# Patient Record
Sex: Male | Born: 1970 | Race: White | Hispanic: No | Marital: Single | Smoking: Never smoker
Health system: Southern US, Community
[De-identification: ages and names within clinical notes are randomized; demographics above are authoritative.]

---

## 1999-05-10 ENCOUNTER — Encounter: Payer: Self-pay | Admitting: Neurosurgery

## 1999-05-10 ENCOUNTER — Encounter: Admission: RE | Admit: 1999-05-10 | Discharge: 1999-05-10 | Payer: Self-pay | Admitting: Neurosurgery

## 1999-06-27 ENCOUNTER — Encounter (INDEPENDENT_AMBULATORY_CARE_PROVIDER_SITE_OTHER): Payer: Self-pay | Admitting: Specialist

## 1999-06-27 ENCOUNTER — Other Ambulatory Visit: Admission: RE | Admit: 1999-06-27 | Discharge: 1999-06-27 | Payer: Self-pay | Admitting: Otolaryngology

## 1999-06-28 ENCOUNTER — Emergency Department (HOSPITAL_COMMUNITY): Admission: EM | Admit: 1999-06-28 | Discharge: 1999-06-28 | Payer: Self-pay | Admitting: Emergency Medicine

## 1999-12-28 ENCOUNTER — Encounter: Payer: Self-pay | Admitting: Neurosurgery

## 1999-12-28 ENCOUNTER — Encounter (HOSPITAL_COMMUNITY): Admission: RE | Admit: 1999-12-28 | Discharge: 2000-03-27 | Payer: Self-pay | Admitting: Neurosurgery

## 2000-01-11 ENCOUNTER — Encounter: Payer: Self-pay | Admitting: Neurosurgery

## 2000-01-25 ENCOUNTER — Encounter: Payer: Self-pay | Admitting: Neurosurgery

## 2000-09-23 ENCOUNTER — Ambulatory Visit (HOSPITAL_COMMUNITY): Admission: RE | Admit: 2000-09-23 | Discharge: 2000-09-23 | Payer: Self-pay | Admitting: Neurosurgery

## 2000-09-23 ENCOUNTER — Encounter: Payer: Self-pay | Admitting: Neurosurgery

## 2000-11-03 ENCOUNTER — Encounter: Admission: RE | Admit: 2000-11-03 | Discharge: 2000-11-03 | Payer: Self-pay | Admitting: Neurosurgery

## 2000-11-03 ENCOUNTER — Encounter: Payer: Self-pay | Admitting: Neurosurgery

## 2000-11-14 ENCOUNTER — Encounter: Payer: Self-pay | Admitting: Neurosurgery

## 2000-11-14 ENCOUNTER — Encounter: Admission: RE | Admit: 2000-11-14 | Discharge: 2000-11-14 | Payer: Self-pay | Admitting: Neurosurgery

## 2000-11-28 ENCOUNTER — Encounter: Admission: RE | Admit: 2000-11-28 | Discharge: 2000-11-28 | Payer: Self-pay | Admitting: Neurosurgery

## 2000-11-28 ENCOUNTER — Encounter: Payer: Self-pay | Admitting: Neurosurgery

## 2004-02-10 ENCOUNTER — Encounter: Admission: RE | Admit: 2004-02-10 | Discharge: 2004-02-10 | Payer: Self-pay | Admitting: Family Medicine

## 2007-07-03 ENCOUNTER — Ambulatory Visit (HOSPITAL_COMMUNITY): Admission: RE | Admit: 2007-07-03 | Discharge: 2007-07-03 | Payer: Self-pay | Admitting: Orthopedic Surgery

## 2007-07-13 ENCOUNTER — Ambulatory Visit (HOSPITAL_COMMUNITY): Admission: RE | Admit: 2007-07-13 | Discharge: 2007-07-13 | Payer: Self-pay | Admitting: Orthopedic Surgery

## 2007-07-24 ENCOUNTER — Ambulatory Visit (HOSPITAL_COMMUNITY): Admission: RE | Admit: 2007-07-24 | Discharge: 2007-07-24 | Payer: Self-pay | Admitting: Orthopedic Surgery

## 2007-08-20 ENCOUNTER — Ambulatory Visit (HOSPITAL_COMMUNITY): Admission: RE | Admit: 2007-08-20 | Discharge: 2007-08-20 | Payer: Self-pay | Admitting: Orthopedic Surgery

## 2007-08-20 ENCOUNTER — Encounter (INDEPENDENT_AMBULATORY_CARE_PROVIDER_SITE_OTHER): Payer: Self-pay | Admitting: Orthopedic Surgery

## 2007-08-20 ENCOUNTER — Ambulatory Visit: Payer: Self-pay | Admitting: Vascular Surgery

## 2008-03-31 ENCOUNTER — Encounter: Admission: RE | Admit: 2008-03-31 | Discharge: 2008-03-31 | Payer: Self-pay | Admitting: Family Medicine

## 2008-10-11 ENCOUNTER — Ambulatory Visit (HOSPITAL_COMMUNITY): Admission: RE | Admit: 2008-10-11 | Discharge: 2008-10-11 | Payer: Self-pay | Admitting: Orthopedic Surgery

## 2008-11-21 ENCOUNTER — Encounter: Admission: RE | Admit: 2008-11-21 | Discharge: 2008-11-21 | Payer: Self-pay | Admitting: Neurosurgery

## 2008-12-06 ENCOUNTER — Encounter: Admission: RE | Admit: 2008-12-06 | Discharge: 2008-12-06 | Payer: Self-pay | Admitting: Neurosurgery

## 2008-12-20 ENCOUNTER — Encounter: Admission: RE | Admit: 2008-12-20 | Discharge: 2008-12-20 | Payer: Self-pay | Admitting: Neurosurgery

## 2009-01-26 ENCOUNTER — Ambulatory Visit (HOSPITAL_COMMUNITY): Admission: RE | Admit: 2009-01-26 | Discharge: 2009-01-26 | Payer: Self-pay | Admitting: Orthopedic Surgery

## 2009-05-10 ENCOUNTER — Encounter: Admission: RE | Admit: 2009-05-10 | Discharge: 2009-05-10 | Payer: Self-pay | Admitting: Neurosurgery

## 2009-06-02 ENCOUNTER — Encounter: Admission: RE | Admit: 2009-06-02 | Discharge: 2009-06-02 | Payer: Self-pay | Admitting: Neurosurgery

## 2009-10-06 ENCOUNTER — Ambulatory Visit (HOSPITAL_COMMUNITY): Admission: RE | Admit: 2009-10-06 | Discharge: 2009-10-08 | Payer: Self-pay | Admitting: Neurosurgery

## 2010-05-11 LAB — COMPREHENSIVE METABOLIC PANEL
ALT: 56 U/L — ABNORMAL HIGH (ref 0–53)
AST: 35 U/L (ref 0–37)
Albumin: 4 g/dL (ref 3.5–5.2)
Alkaline Phosphatase: 63 U/L (ref 39–117)
BUN: 6 mg/dL (ref 6–23)
CO2: 29 mEq/L (ref 19–32)
Calcium: 9.8 mg/dL (ref 8.4–10.5)
Chloride: 102 mEq/L (ref 96–112)
Creatinine, Ser: 0.77 mg/dL (ref 0.4–1.5)
GFR calc Af Amer: >60 mL/min (ref >60)
GFR calc non Af Amer: >60 mL/min (ref >60)
Glucose, Bld: 226 mg/dL — ABNORMAL HIGH (ref 70–99)
Potassium: 4.3 mEq/L (ref 3.5–5.1)
Sodium: 136 mEq/L (ref 135–145)
Total Bilirubin: 1 mg/dL (ref 0.3–1.2)
Total Protein: 6.4 g/dL (ref 6.0–8.3)

## 2010-05-11 LAB — URINALYSIS, ROUTINE W REFLEX MICROSCOPIC
Bilirubin Urine: NEGATIVE
Glucose, UA: >1000 mg/dL — AB
Hgb urine dipstick: NEGATIVE
Ketones, ur: NEGATIVE mg/dL
Leukocytes, UA: NEGATIVE
Nitrite: NEGATIVE
Protein, ur: NEGATIVE mg/dL
Specific Gravity, Urine: 1.022 (ref 1.005–1.030)
Urobilinogen, UA: 0.2 mg/dL (ref 0.0–1.0)
pH: 6 (ref 5.0–8.0)

## 2010-05-11 LAB — CBC
HCT: 44.5 % (ref 39.0–52.0)
Hemoglobin: 15.4 g/dL (ref 13.0–17.0)
MCH: 30.6 pg (ref 26.0–34.0)
MCHC: 34.6 g/dL (ref 30.0–36.0)
MCV: 88.3 fL (ref 78.0–100.0)
Platelets: 248 10*3/uL (ref 150–400)
RBC: 5.04 MIL/uL (ref 4.22–5.81)
RDW: 12.3 % (ref 11.5–15.5)
WBC: 7.2 10*3/uL (ref 4.0–10.5)

## 2010-05-11 LAB — DIFFERENTIAL
Basophils Absolute: 0.1 10*3/uL (ref 0.0–0.1)
Basophils Relative: 1 % (ref 0–1)
Eosinophils Absolute: 0.3 10*3/uL (ref 0.0–0.7)
Eosinophils Relative: 4 % (ref 0–5)
Lymphocytes Relative: 24 % (ref 12–46)
Lymphs Abs: 1.7 10*3/uL (ref 0.7–4.0)
Monocytes Absolute: 0.5 10*3/uL (ref 0.1–1.0)
Monocytes Relative: 8 % (ref 3–12)
Neutro Abs: 4.6 10*3/uL (ref 1.7–7.7)
Neutrophils Relative %: 64 % (ref 43–77)

## 2010-05-11 LAB — SURGICAL PCR SCREEN
MRSA, PCR: NEGATIVE
Staphylococcus aureus: NEGATIVE

## 2010-05-11 LAB — URINE MICROSCOPIC-ADD ON

## 2010-05-11 LAB — PROTIME-INR
INR: 0.94 (ref 0.00–1.49)
Prothrombin Time: 12.8 seconds (ref 11.6–15.2)

## 2010-05-11 LAB — APTT: aPTT: 26 seconds (ref 24–37)

## 2010-05-11 LAB — GLUCOSE, CAPILLARY: Glucose-Capillary: 171 mg/dL — ABNORMAL HIGH (ref 70–99)

## 2012-09-05 ENCOUNTER — Encounter (HOSPITAL_COMMUNITY): Payer: Self-pay | Admitting: *Deleted

## 2012-09-05 ENCOUNTER — Emergency Department (HOSPITAL_COMMUNITY): Payer: PRIVATE HEALTH INSURANCE

## 2012-09-05 ENCOUNTER — Emergency Department (HOSPITAL_COMMUNITY)
Admission: EM | Admit: 2012-09-05 | Discharge: 2012-09-05 | Disposition: A | Discharge status: Other Acute Inpatient Hospital | Payer: PRIVATE HEALTH INSURANCE | Attending: Emergency Medicine | Admitting: Emergency Medicine

## 2012-09-05 DIAGNOSIS — D72829 Elevated white blood cell count, unspecified: Secondary | ICD-10-CM | POA: Insufficient documentation

## 2012-09-05 DIAGNOSIS — T2610XA Burn of cornea and conjunctival sac, unspecified eye, initial encounter: Secondary | ICD-10-CM | POA: Insufficient documentation

## 2012-09-05 DIAGNOSIS — Z87891 Personal history of nicotine dependence: Secondary | ICD-10-CM | POA: Insufficient documentation

## 2012-09-05 DIAGNOSIS — S61409A Unspecified open wound of unspecified hand, initial encounter: Secondary | ICD-10-CM | POA: Insufficient documentation

## 2012-09-05 DIAGNOSIS — S6290XA Unspecified fracture of unspecified wrist and hand, initial encounter for closed fracture: Secondary | ICD-10-CM | POA: Insufficient documentation

## 2012-09-05 DIAGNOSIS — IMO0002 Reserved for concepts with insufficient information to code with codable children: Secondary | ICD-10-CM | POA: Insufficient documentation

## 2012-09-05 DIAGNOSIS — S31109A Unspecified open wound of abdominal wall, unspecified quadrant without penetration into peritoneal cavity, initial encounter: Secondary | ICD-10-CM

## 2012-09-05 DIAGNOSIS — S61419A Laceration without foreign body of unspecified hand, initial encounter: Secondary | ICD-10-CM | POA: Insufficient documentation

## 2012-09-05 DIAGNOSIS — S21109A Unspecified open wound of unspecified front wall of thorax without penetration into thoracic cavity, initial encounter: Secondary | ICD-10-CM

## 2012-09-05 DIAGNOSIS — H833X3 Noise effects on inner ear, bilateral: Secondary | ICD-10-CM

## 2012-09-05 DIAGNOSIS — T2660XA Corrosion of cornea and conjunctival sac, unspecified eye, initial encounter: Secondary | ICD-10-CM | POA: Insufficient documentation

## 2012-09-05 DIAGNOSIS — T23199A Burn of first degree of multiple sites of unspecified wrist and hand, initial encounter: Secondary | ICD-10-CM

## 2012-09-05 DIAGNOSIS — Y929 Unspecified place or not applicable: Secondary | ICD-10-CM | POA: Insufficient documentation

## 2012-09-05 DIAGNOSIS — S0500XA Injury of conjunctiva and corneal abrasion without foreign body, unspecified eye, initial encounter: Secondary | ICD-10-CM | POA: Insufficient documentation

## 2012-09-05 DIAGNOSIS — S09313A Primary blast injury of ear, bilateral, initial encounter: Secondary | ICD-10-CM

## 2012-09-05 DIAGNOSIS — S01309A Unspecified open wound of unspecified ear, initial encounter: Secondary | ICD-10-CM

## 2012-09-05 DIAGNOSIS — W409XXA Explosion of unspecified explosive materials, initial encounter: Secondary | ICD-10-CM | POA: Insufficient documentation

## 2012-09-05 DIAGNOSIS — Y9389 Activity, other specified: Secondary | ICD-10-CM | POA: Insufficient documentation

## 2012-09-05 DIAGNOSIS — H9190 Unspecified hearing loss, unspecified ear: Secondary | ICD-10-CM | POA: Insufficient documentation

## 2012-09-05 DIAGNOSIS — S0460XA Injury of acoustic nerve, unspecified side, initial encounter: Secondary | ICD-10-CM | POA: Insufficient documentation

## 2012-09-05 DIAGNOSIS — Z23 Encounter for immunization: Secondary | ICD-10-CM | POA: Insufficient documentation

## 2012-09-05 DIAGNOSIS — S0180XA Unspecified open wound of other part of head, initial encounter: Secondary | ICD-10-CM

## 2012-09-05 DIAGNOSIS — J690 Pneumonitis due to inhalation of food and vomit: Secondary | ICD-10-CM | POA: Insufficient documentation

## 2012-09-05 DIAGNOSIS — T2111XA Burn of first degree of chest wall, initial encounter: Secondary | ICD-10-CM | POA: Insufficient documentation

## 2012-09-05 DIAGNOSIS — T2620XA Burn with resulting rupture and destruction of unspecified eyeball, initial encounter: Secondary | ICD-10-CM | POA: Insufficient documentation

## 2012-09-05 DIAGNOSIS — T1490XA Injury, unspecified, initial encounter: Secondary | ICD-10-CM | POA: Insufficient documentation

## 2012-09-05 DIAGNOSIS — S0920XA Traumatic rupture of unspecified ear drum, initial encounter: Secondary | ICD-10-CM

## 2012-09-05 DIAGNOSIS — W400XXA Explosion of blasting material, initial encounter: Secondary | ICD-10-CM | POA: Insufficient documentation

## 2012-09-05 DIAGNOSIS — I10 Essential (primary) hypertension: Secondary | ICD-10-CM | POA: Insufficient documentation

## 2012-09-05 DIAGNOSIS — T2016XA Burn of first degree of forehead and cheek, initial encounter: Secondary | ICD-10-CM | POA: Insufficient documentation

## 2012-09-05 DIAGNOSIS — S6980XA Other specified injuries of unspecified wrist, hand and finger(s), initial encounter: Secondary | ICD-10-CM

## 2012-09-05 LAB — CARBOXYHEMOGLOBIN: O2 Saturation: 98.8 %

## 2012-09-05 LAB — BLOOD GAS, ARTERIAL
Bicarbonate: 23.7 mEq/L (ref 20.0–24.0)
Patient temperature: 98.6
pCO2 arterial: 48.6 mmHg — ABNORMAL HIGH (ref 35.0–45.0)
pH, Arterial: 7.31 — ABNORMAL LOW (ref 7.350–7.450)

## 2012-09-05 LAB — POCT I-STAT, CHEM 8
Chloride: 103 mEq/L (ref 96–112)
Creatinine, Ser: 1 mg/dL (ref 0.50–1.35)
HCT: 44 % (ref 39.0–52.0)
Hemoglobin: 15 g/dL (ref 13.0–17.0)
Potassium: 3.5 mEq/L (ref 3.5–5.1)
Sodium: 141 mEq/L (ref 135–145)

## 2012-09-05 MED ORDER — LORAZEPAM 2 MG/ML IJ SOLN
2.0000 mg | Freq: Once | INTRAMUSCULAR | Status: AC
  Administered 2012-09-05: 2 mg via INTRAVENOUS

## 2012-09-05 MED ORDER — ONDANSETRON HCL 4 MG/2ML IJ SOLN
INTRAMUSCULAR | Status: AC
  Administered 2012-09-05: 4 mg
  Filled 2012-09-05: qty 2

## 2012-09-05 MED ORDER — DEXTROSE 5 % IV SOLN
1.0000 g | Freq: Once | INTRAVENOUS | Status: AC
  Administered 2012-09-05: 1 g via INTRAVENOUS
  Filled 2012-09-05: qty 1

## 2012-09-05 MED ORDER — HYDROMORPHONE HCL PF 2 MG/ML IJ SOLN: 2.0000 mg | Freq: Once | INTRAMUSCULAR | Status: AC

## 2012-09-05 MED ORDER — PROPOFOL 10 MG/ML IV EMUL
5.0000 ug/kg/min | Freq: Once | INTRAVENOUS | Status: AC
  Administered 2012-09-05: 10 ug/kg/min via INTRAVENOUS
  Filled 2012-09-05: qty 100

## 2012-09-05 MED ORDER — PROPOFOL 10 MG/ML IV EMUL
INTRAVENOUS | Status: AC
  Filled 2012-09-05: qty 100

## 2012-09-05 MED ORDER — IPRATROPIUM BROMIDE 0.02 % IN SOLN
RESPIRATORY_TRACT | Status: AC
  Filled 2012-09-05: qty 2.5

## 2012-09-05 MED ORDER — LACTATED RINGERS IV SOLN
INTRAVENOUS | Status: DC
  Administered 2012-09-05: 01:00:00 via INTRAVENOUS

## 2012-09-05 MED ORDER — TETANUS-DIPHTH-ACELL PERTUSSIS 5-2.5-18.5 LF-MCG/0.5 IM SUSP
INTRAMUSCULAR | Status: AC
  Administered 2012-09-05: 0.5 mL via INTRAMUSCULAR
  Filled 2012-09-05: qty 0.5

## 2012-09-05 MED ORDER — IPRATROPIUM BROMIDE 0.02 % IN SOLN
0.5000 mg | Freq: Once | RESPIRATORY_TRACT | Status: AC
  Administered 2012-09-05: 0.5 mg via RESPIRATORY_TRACT

## 2012-09-05 MED ORDER — ROCURONIUM BROMIDE 50 MG/5ML IV SOLN
INTRAVENOUS | Status: AC
  Filled 2012-09-05: qty 2

## 2012-09-05 MED ORDER — ALBUTEROL SULFATE (5 MG/ML) 0.5% IN NEBU
INHALATION_SOLUTION | RESPIRATORY_TRACT | Status: AC
  Filled 2012-09-05: qty 1

## 2012-09-05 MED ORDER — HYDROMORPHONE HCL PF 1 MG/ML IJ SOLN
INTRAMUSCULAR | Status: AC
  Administered 2012-09-05: 2 mg
  Filled 2012-09-05: qty 2

## 2012-09-05 MED ORDER — SUCCINYLCHOLINE CHLORIDE 20 MG/ML IJ SOLN
INTRAMUSCULAR | Status: AC
  Administered 2012-09-05: 120 mg
  Filled 2012-09-05: qty 1

## 2012-09-05 MED ORDER — ALBUTEROL SULFATE (5 MG/ML) 0.5% IN NEBU
5.0000 mg | INHALATION_SOLUTION | Freq: Once | RESPIRATORY_TRACT | Status: AC
  Administered 2012-09-05: 5 mg via RESPIRATORY_TRACT

## 2012-09-05 MED ORDER — LORAZEPAM 2 MG/ML IJ SOLN
INTRAMUSCULAR | Status: AC
  Filled 2012-09-05: qty 1

## 2012-09-05 MED ORDER — FENTANYL CITRATE 0.05 MG/ML IJ SOLN
INTRAMUSCULAR | Status: AC
  Administered 2012-09-05: 100 ug
  Filled 2012-09-05: qty 2

## 2012-09-05 MED ORDER — SODIUM CHLORIDE 0.9 % IV SOLN
50.0000 ug/h | INTRAVENOUS | Status: DC
  Administered 2012-09-05: 50 ug/h via INTRAVENOUS
  Filled 2012-09-05: qty 50

## 2012-09-05 MED ORDER — ETOMIDATE 2 MG/ML IV SOLN
INTRAVENOUS | Status: AC
  Administered 2012-09-05: 40 mg
  Filled 2012-09-05: qty 20

## 2012-09-05 MED ORDER — LIDOCAINE HCL (CARDIAC) 20 MG/ML IV SOLN
INTRAVENOUS | Status: AC
  Filled 2012-09-05: qty 5
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
## 2012-09-05 NOTE — ED Notes (Signed)
[facility] at bedside 
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
## 2012-09-05 NOTE — ED Provider Notes (Addendum)
Was present for all portions of the procedure in an immediate supervisory role, observed resident placing endotracheal tube and I personally confirmed tube placement. Jones Skene, M.D.   Jones Skene, MD 09/05/12 0559  Jones Skene, MD 09/16/12 2303

## 2012-09-05 NOTE — ED Notes (Signed)
Teacher, English as a foreign language - given report at [facility] ED

## 2012-09-05 NOTE — ED Notes (Signed)
Pt informed for the need of intubation. Pt gave EDP verbal consent to intubate

## 2012-09-05 NOTE — ED Notes (Signed)
Family updated as to patient's status.
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
## 2012-09-06 DIAGNOSIS — S0920XA Traumatic rupture of unspecified ear drum, initial encounter: Secondary | ICD-10-CM | POA: Insufficient documentation

## 2012-09-06 LAB — TYPE AND SCREEN
ABO/RH(D): A POS
Antibody Screen: NEGATIVE
Unit division: 0

## 2012-09-07 DIAGNOSIS — R739 Hyperglycemia, unspecified: Secondary | ICD-10-CM | POA: Insufficient documentation

## 2012-09-07 DIAGNOSIS — E871 Hypo-osmolality and hyponatremia: Secondary | ICD-10-CM | POA: Insufficient documentation

## 2012-09-07 DIAGNOSIS — R7989 Other specified abnormal findings of blood chemistry: Secondary | ICD-10-CM | POA: Insufficient documentation

## 2012-09-07 DIAGNOSIS — T07XXXA Unspecified multiple injuries, initial encounter: Secondary | ICD-10-CM | POA: Insufficient documentation

## 2012-09-07 DIAGNOSIS — S62609A Fracture of unspecified phalanx of unspecified finger, initial encounter for closed fracture: Secondary | ICD-10-CM | POA: Insufficient documentation

## 2012-09-07 DIAGNOSIS — G8911 Acute pain due to trauma: Secondary | ICD-10-CM | POA: Insufficient documentation

## 2012-09-07 DIAGNOSIS — H9193 Unspecified hearing loss, bilateral: Secondary | ICD-10-CM | POA: Insufficient documentation

## 2012-10-28 DIAGNOSIS — M7520 Bicipital tendinitis, unspecified shoulder: Secondary | ICD-10-CM | POA: Insufficient documentation

## 2012-12-07 DIAGNOSIS — S46119A Strain of muscle, fascia and tendon of long head of biceps, unspecified arm, initial encounter: Secondary | ICD-10-CM | POA: Insufficient documentation

## 2012-12-21 ENCOUNTER — Other Ambulatory Visit (HOSPITAL_COMMUNITY): Payer: Self-pay | Admitting: Orthopedic Surgery

## 2012-12-21 ENCOUNTER — Ambulatory Visit (HOSPITAL_COMMUNITY)
Admission: RE | Admit: 2012-12-21 | Discharge: 2012-12-21 | Disposition: A | Discharge status: Home / Self Care | Payer: PRIVATE HEALTH INSURANCE | Source: Ambulatory Visit | Attending: Orthopedic Surgery | Admitting: Orthopedic Surgery

## 2012-12-21 DIAGNOSIS — M25511 Pain in right shoulder: Secondary | ICD-10-CM

## 2012-12-21 DIAGNOSIS — Z1389 Encounter for screening for other disorder: Secondary | ICD-10-CM | POA: Insufficient documentation

## 2014-09-30 ENCOUNTER — Ambulatory Visit (INDEPENDENT_AMBULATORY_CARE_PROVIDER_SITE_OTHER): Payer: 59 | Admitting: Podiatry

## 2014-09-30 ENCOUNTER — Encounter: Payer: Self-pay | Admitting: Podiatry

## 2014-09-30 ENCOUNTER — Ambulatory Visit (INDEPENDENT_AMBULATORY_CARE_PROVIDER_SITE_OTHER): Payer: 59

## 2014-09-30 VITALS — BP 143/91 | HR 78 | Resp 16 | Ht 74.0 in | Wt 250.0 lb

## 2014-09-30 DIAGNOSIS — M779 Enthesopathy, unspecified: Secondary | ICD-10-CM | POA: Diagnosis not present

## 2014-09-30 DIAGNOSIS — M79672 Pain in left foot: Secondary | ICD-10-CM

## 2014-09-30 DIAGNOSIS — M10072 Idiopathic gout, left ankle and foot: Secondary | ICD-10-CM | POA: Diagnosis not present

## 2014-09-30 MED ORDER — MELOXICAM 15 MG PO TABS: 15.0000 mg | ORAL_TABLET | Freq: Every day | ORAL | Status: DC

## 2014-09-30 MED ORDER — METHYLPREDNISOLONE 4 MG PO TBPK: ORAL_TABLET | ORAL | Status: DC

## 2014-09-30 NOTE — Progress Notes (Signed)
   Subjective:    Patient ID: Robert Shah, male    DOB: June 14, 1970, 43 y.o.   MRN: [id number]  HPI Comments: "I have pain on the bottom under the toes"  Patient c/o aching plantar forefoot left for about 1 month. Feels like a marble when walking. PCP said might be a "nerve bundle issue" and referred here.   Also, he states last night he noticed posterior heel left was "blood red".    he states this posterior heel pain comes up every single year sometimes twice a year is exquisitely painful and his blood red. He also states there is very warm to the touch and he can hardly put his foot down.  Review of Systems  HENT: Positive for hearing loss, sinus pressure and tinnitus.   Musculoskeletal: Positive for gait problem.  Allergic/Immunologic: Positive for environmental allergies.  All other systems reviewed and are negative.      Objective:   Physical Exam: I have reviewed his past medical history medications allergies surgery social history review of symptoms complaint and statement. Pulses are strongly palpable bilateral. Neurologic sensorium is intact. Deep tendon reflexes are intact bilateral. Muscle strength is 5 over 5 dorsiflexion and plantar flexors and inverters everters onto the musculatures intact. He has severe pain on palpation of the second metatarsophalangeal joint of the left foot. He also has pain on palpation of the tendo Achilles at its insertion site particularly along the medial insertion extending into the medial heel region. This area is erythematous and edematous and warm to the touch. He has no pain along the tendons themselves with exception of the distal medial aspect of the Achilles. He has pain on palpation and range of motion of the second metatarsophalangeal joint without erythema or edema. Radiographs do not demonstrate any type of osseus abnormalities in these areas.        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  Capsulitis second metatarsophalangeal joint left foot.  Plan: Discussed etiology pathology conservative versus surgical therapies. Injected the area today at the second metatarsophalangeal joint after sterile Betadine skin prep dexamethasone and local and aesthetic. Placed him on a Medrol Dosepak to be followed by meloxicam. Discussed appropriate shoe gear stretching exercises ice therapy sugar modifications. I will follow-up with him in 1 month.

## 2014-10-03 ENCOUNTER — Ambulatory Visit: Payer: Self-pay | Admitting: Podiatry

## 2014-10-27 ENCOUNTER — Encounter: Payer: Self-pay | Admitting: Podiatry

## 2014-10-27 ENCOUNTER — Ambulatory Visit (INDEPENDENT_AMBULATORY_CARE_PROVIDER_SITE_OTHER): Payer: 59 | Admitting: Podiatry

## 2014-10-27 VITALS — BP 163/91 | HR 81 | Resp 16

## 2014-10-27 DIAGNOSIS — L600 Ingrowing nail: Secondary | ICD-10-CM

## 2014-10-27 DIAGNOSIS — M779 Enthesopathy, unspecified: Secondary | ICD-10-CM

## 2014-10-27 MED ORDER — DICLOFENAC SODIUM 75 MG PO TBEC: 75.0000 mg | DELAYED_RELEASE_TABLET | Freq: Two times a day (BID) | ORAL | Status: DC

## 2014-10-27 MED ORDER — NEOMYCIN-POLYMYXIN-HC 3.5-10000-1 OT SOLN: OTIC | Status: DC

## 2014-10-27 NOTE — Patient Instructions (Signed)

## 2014-10-29 NOTE — Progress Notes (Signed)
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 He states that the Achilles is doing much better and he still has some tenderness though much less around the second metatarsophalangeal joint. He states that he seems to be doing pretty well. He is however concerned about the ingrown nail to the right hallux he states bothers him with shoe gear and is loose along the distal aspect develops subungual debris. He would like to have the margins removed. I discussed with him whether or not he would like to have the whole nail removed and he declined at this time.  Objective: Vital signs are stable he is alert and oriented 3. Pulses remain palpable bilateral. Neurologic sensorium is intact. Muscle strength is normal bilateral. Orthopedic evaluation does demonstrate no pain on palpation and range of motion around the Achilles left heel. Some mild tenderness on in range of motion of the second metatarsophalangeal joint left. This is consistent with residual capsulitis. Cutaneous evaluation does demonstrate a greater nail margins along the tibial and fibular border of the hallux right. Distal onychomycosis is also noted. No purulence and no malodor.  Assessment: Well-healing Achilles tendinitis left approximately 50% improvement of capsulitis second metatarsophalangeal joint left ingrown nail paronychia hallux right tibial and fibula border.  Plan: We discussed the etiology pathology conservative versus surgical therapies. At this point I performed a periarticular injection around the second metatarsophalangeal joint of the left foot Kenalog and local aspect. He also performed a chemical matrixectomy to the tibial and fibular borders of the hallux right. We actually broke a portion of the nail clipper which struck the patient in the arm but caused no skin break. Once the margins were removed seen all was applied to the nail  borders and the nailbed 30 seconds each 3 applications which was then neutralized with isopropyl alcohol Silvadene cream Without aggressive compressive dressing was applied both oral and written home going instructions were provided for the soaking of his toe. He was also provided with prescription for Cortisporin otic to apply to the toe twice a day after soaking. We also prescribed to him with a prescription for diclofenac 75 mg 1 by mouth twice a day 60 as an anti-inflammatory and he should discontinue the use of the meloxicam. He was also scanned for set of orthotics today and I will follow-up with him in 1 week to recheck the toe.  Arbutus Ped DPM

## 2014-11-03 ENCOUNTER — Ambulatory Visit (INDEPENDENT_AMBULATORY_CARE_PROVIDER_SITE_OTHER): Payer: 59 | Admitting: Podiatry

## 2014-11-03 ENCOUNTER — Encounter: Payer: Self-pay | Admitting: Podiatry

## 2014-11-03 DIAGNOSIS — L600 Ingrowing nail: Secondary | ICD-10-CM | POA: Diagnosis not present

## 2014-11-03 NOTE — Progress Notes (Signed)
He presents today for follow-up of matrixectomy tibial and fibular border of the hallux right. He states that he seems to be doing good though was a little bit read the other day he looks much better now. He denies soaking at all. He states that while here he would like to have the other ingrown to the hallux left taken out on both sides as well. He states that this is been bothering him for quite some time and he would like to have these removed now he knows there was no trouble.  Objective: Vital signs are stable he's alert and oriented 3 minimal erythema no edema saline drainage or odor hallux right. Pulses remain strongly palpable bilateral. Sharp incurvated nail margins along the tibial and fibular border of the hallux left indicative of ingrown nail.  Assessment: Slowly healing matrixectomy hallux right non-complicated. Ingrown nail paronychia hallux left.  Plan: I encouraged him to start soaking it is also warm water for the right foot. He will not utilize Cortisporin otic since he is allergic to it. At this point we performed a chemical matrixectomy to the tibial and fibular border of the hallux left. He tolerated this procedure well after local anesthesia was administered. He will follow last weeks guidelines and hopefully he will start soaking twice daily. Follow-up with him at least one week.

## 2014-11-03 NOTE — Patient Instructions (Addendum)

## 2014-11-10 ENCOUNTER — Ambulatory Visit (INDEPENDENT_AMBULATORY_CARE_PROVIDER_SITE_OTHER): Payer: 59 | Admitting: Podiatry

## 2014-11-10 ENCOUNTER — Encounter: Payer: Self-pay | Admitting: Podiatry

## 2014-11-10 DIAGNOSIS — L600 Ingrowing nail: Secondary | ICD-10-CM

## 2014-11-10 MED ORDER — CEPHALEXIN 500 MG PO CAPS: 500.0000 mg | ORAL_CAPSULE | Freq: Three times a day (TID) | ORAL | Status: DC

## 2014-11-10 NOTE — Progress Notes (Signed)
He presents today for follow-up of his matrixectomy's hallux right and hallux left. He also states that his second metatarsophalangeal joint is doing much better. He states that the diclofenac has made everything started to feel better including his neck and back from previous trauma. He states that he removed a piece of nail margin from the fibular border of the hallux right. Though he does state he has not had time to soak his feet as prescribed on a regular basis. I expressed to him that he runs a risk of a major infection if he does not soak he understands that and is amenable to it.  Objective: Vital signs are stable he is alert and oriented 3 he has moderate paronychia of the hallux matrixectomy sites bilateral. There is some drainage but appears to be serosanguineous in nature and there is no malodor. He does have mild cellulitis extending to the level of the IP joint of the hallux bilateral. He has no pain on palpation of the second metatarsophalangeal joint.  Assessment: Paronychia status post matrixectomy secondary to noncompliance. Capsulitis second metatarsophalangeal joint left foot.  Plan: Continue the use of the diclofenac on a regular basis. I encouraged him to try to soak the toes twice a day Epsom salts and warm water and apply Cortisporin Otic as directed. I prescribed a antibiotic, cephalexin 500 mg 1 by mouth 3 times a day for the next 10 days. I will follow-up with him in 2 weeks if needed. We may need to consider orthotics.

## 2014-11-10 NOTE — Patient Instructions (Signed)

## 2014-12-06 ENCOUNTER — Encounter: Payer: Self-pay | Admitting: Podiatry

## 2014-12-06 ENCOUNTER — Ambulatory Visit (INDEPENDENT_AMBULATORY_CARE_PROVIDER_SITE_OTHER): Payer: 59 | Admitting: Podiatry

## 2014-12-06 VITALS — BP 129/93 | HR 74 | Resp 16

## 2014-12-06 DIAGNOSIS — M779 Enthesopathy, unspecified: Secondary | ICD-10-CM | POA: Diagnosis not present

## 2014-12-06 NOTE — Progress Notes (Signed)
   Subjective:    Patient ID: Robert Shah, male    DOB: 09-Mar-1970, 44 y.o.   MRN: [id number]  HPI PUO on 12/06/14   Review of Systems     Objective:   Physical Exam        Assessment & Plan:

## 2014-12-07 NOTE — Progress Notes (Signed)
He presents today chief complaint of painful second metatarsophalangeal joint. He states is doing better than it was previously however he is looking forward wearing orthotics in hopes that this will help alleviate his symptoms.  Objective: Vital signs are stable alert and oriented 3. Pulses are palpable. He has pain on end range of motion of the second metatarsophalangeal joint of the left foot. No erythema edema saline as drainage or odor.  Assessment: Capsulitis second metatarsophalangeal joint left.  Plan: After sterile Betadine skin prep I reinjected the second metatarsophalangeal joint today with dexamethasone and local anesthetic. I also dispensed his orthotics he was provided with oral and written home-going instructions for care and use of the orthotics and will follow up with him in 6 weeks for reevaluation. We discussed the necessity for good shoe gear to help the orthotics do their job.  Arbutus Pedodd Karmel Patricelli DPM

## 2014-12-29 ENCOUNTER — Other Ambulatory Visit: Payer: Self-pay | Admitting: Neurosurgery

## 2014-12-29 DIAGNOSIS — Z139 Encounter for screening, unspecified: Secondary | ICD-10-CM

## 2014-12-29 DIAGNOSIS — M5126 Other intervertebral disc displacement, lumbar region: Secondary | ICD-10-CM

## 2014-12-30 ENCOUNTER — Other Ambulatory Visit: Payer: PRIVATE HEALTH INSURANCE

## 2015-01-02 ENCOUNTER — Ambulatory Visit
Admission: RE | Admit: 2015-01-02 | Discharge: 2015-01-02 | Disposition: A | Discharge status: Home / Self Care | Payer: 59 | Source: Ambulatory Visit | Attending: Neurosurgery | Admitting: Neurosurgery

## 2015-01-02 DIAGNOSIS — Z139 Encounter for screening, unspecified: Secondary | ICD-10-CM

## 2015-01-08 ENCOUNTER — Ambulatory Visit
Admission: RE | Admit: 2015-01-08 | Discharge: 2015-01-08 | Disposition: A | Discharge status: Home / Self Care | Payer: 59 | Source: Ambulatory Visit | Attending: Neurosurgery | Admitting: Neurosurgery

## 2015-01-08 DIAGNOSIS — M5126 Other intervertebral disc displacement, lumbar region: Secondary | ICD-10-CM

## 2015-01-24 ENCOUNTER — Ambulatory Visit (INDEPENDENT_AMBULATORY_CARE_PROVIDER_SITE_OTHER): Payer: 59 | Admitting: Podiatry

## 2015-01-24 ENCOUNTER — Encounter: Payer: Self-pay | Admitting: Podiatry

## 2015-01-24 VITALS — BP 173/80 | HR 85 | Resp 16

## 2015-01-24 DIAGNOSIS — M779 Enthesopathy, unspecified: Secondary | ICD-10-CM | POA: Diagnosis not present

## 2015-01-24 NOTE — Progress Notes (Signed)
He follows up today for a discussion regarding his second metatarsophalangeal joint left foot. He states that he has questions regarding the longevity of the joint and whether this will be a chronic condition. He also states that the orthotics seem to be making a difference.  Objective: Vital signs are stable he is alert and oriented 3. Pulses are strongly palpable. Neurologic sensorium is intact per Semmes-Weinstein monofilament. Deep tendon reflexes are intact. Muscle strength +5 over 5 dorsiflexion plantar flexors and inverters and evertors all intrinsic musculature is intact. As much decrease in edema some second metatarsophalangeal joint left foot minimal pain on range of motion of the joint. 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  Plan: Continue use of his orthotics we did discuss the need for surgical intervention regarding the bunion and second metatarsal. I will follow-up with him on an as-needed basis.  Arbutus Pedodd Hyatt DPM

## 2015-02-09 ENCOUNTER — Other Ambulatory Visit: Payer: Self-pay | Admitting: Neurosurgery

## 2015-02-09 DIAGNOSIS — M5126 Other intervertebral disc displacement, lumbar region: Secondary | ICD-10-CM

## 2015-02-15 ENCOUNTER — Ambulatory Visit
Admission: RE | Admit: 2015-02-15 | Discharge: 2015-02-15 | Disposition: A | Discharge status: Home / Self Care | Payer: 59 | Source: Ambulatory Visit | Attending: Neurosurgery | Admitting: Neurosurgery

## 2015-02-15 DIAGNOSIS — M4322 Fusion of spine, cervical region: Secondary | ICD-10-CM | POA: Insufficient documentation

## 2015-02-15 DIAGNOSIS — E119 Type 2 diabetes mellitus without complications: Secondary | ICD-10-CM | POA: Insufficient documentation

## 2015-02-15 DIAGNOSIS — M549 Dorsalgia, unspecified: Secondary | ICD-10-CM | POA: Insufficient documentation

## 2015-02-15 DIAGNOSIS — M5126 Other intervertebral disc displacement, lumbar region: Secondary | ICD-10-CM

## 2015-02-15 MED ORDER — METHYLPREDNISOLONE ACETATE 40 MG/ML INJ SUSP (RADIOLOG
120.0000 mg | Freq: Once | INTRAMUSCULAR | Status: AC
  Administered 2015-02-15: 120 mg via EPIDURAL

## 2015-02-15 MED ORDER — IOHEXOL 180 MG/ML  SOLN
1.0000 mL | Freq: Once | INTRAMUSCULAR | Status: AC | PRN
  Administered 2015-02-15: 1 mL via EPIDURAL

## 2015-02-15 NOTE — Discharge Instructions (Signed)

## 2015-07-25 DIAGNOSIS — G5632 Lesion of radial nerve, left upper limb: Secondary | ICD-10-CM | POA: Insufficient documentation

## 2015-07-25 DIAGNOSIS — R52 Pain, unspecified: Secondary | ICD-10-CM | POA: Insufficient documentation

## 2015-07-25 DIAGNOSIS — M7701 Medial epicondylitis, right elbow: Secondary | ICD-10-CM | POA: Insufficient documentation

## 2015-11-17 ENCOUNTER — Other Ambulatory Visit: Payer: Self-pay | Admitting: Nurse Practitioner

## 2015-11-17 DIAGNOSIS — M5126 Other intervertebral disc displacement, lumbar region: Secondary | ICD-10-CM

## 2015-12-01 ENCOUNTER — Other Ambulatory Visit: Payer: 59

## 2015-12-01 ENCOUNTER — Ambulatory Visit
Admission: RE | Admit: 2015-12-01 | Discharge: 2015-12-01 | Disposition: A | Discharge status: Home / Self Care | Payer: 59 | Source: Ambulatory Visit | Attending: Nurse Practitioner | Admitting: Nurse Practitioner

## 2015-12-01 DIAGNOSIS — M5126 Other intervertebral disc displacement, lumbar region: Secondary | ICD-10-CM

## 2015-12-01 MED ORDER — IOPAMIDOL (ISOVUE-M 200) INJECTION 41%
1.0000 mL | Freq: Once | INTRAMUSCULAR | Status: AC
  Administered 2015-12-01: 1 mL via EPIDURAL

## 2015-12-01 MED ORDER — METHYLPREDNISOLONE ACETATE 40 MG/ML INJ SUSP (RADIOLOG
120.0000 mg | Freq: Once | INTRAMUSCULAR | Status: AC
  Administered 2015-12-01: 120 mg via EPIDURAL

## 2016-09-09 ENCOUNTER — Other Ambulatory Visit: Payer: Self-pay | Admitting: Nurse Practitioner

## 2016-09-09 DIAGNOSIS — M5126 Other intervertebral disc displacement, lumbar region: Secondary | ICD-10-CM

## 2016-09-09 DIAGNOSIS — Z77018 Contact with and (suspected) exposure to other hazardous metals: Secondary | ICD-10-CM

## 2016-09-19 ENCOUNTER — Inpatient Hospital Stay: Admission: RE | Admit: 2016-09-19 | Payer: 59 | Source: Ambulatory Visit

## 2016-09-20 ENCOUNTER — Ambulatory Visit
Admission: RE | Admit: 2016-09-20 | Discharge: 2016-09-20 | Disposition: A | Discharge status: Home / Self Care | Payer: 59 | Source: Ambulatory Visit | Attending: Nurse Practitioner | Admitting: Nurse Practitioner

## 2016-09-20 DIAGNOSIS — M5126 Other intervertebral disc displacement, lumbar region: Secondary | ICD-10-CM

## 2016-10-02 ENCOUNTER — Other Ambulatory Visit: Payer: Self-pay | Admitting: Nurse Practitioner

## 2016-10-02 DIAGNOSIS — M5126 Other intervertebral disc displacement, lumbar region: Secondary | ICD-10-CM

## 2016-10-10 ENCOUNTER — Ambulatory Visit
Admission: RE | Admit: 2016-10-10 | Discharge: 2016-10-10 | Disposition: A | Discharge status: Home / Self Care | Payer: 59 | Source: Ambulatory Visit | Attending: Nurse Practitioner | Admitting: Nurse Practitioner

## 2016-10-10 DIAGNOSIS — M5126 Other intervertebral disc displacement, lumbar region: Secondary | ICD-10-CM

## 2016-10-10 MED ORDER — METHYLPREDNISOLONE ACETATE 40 MG/ML INJ SUSP (RADIOLOG
120.0000 mg | Freq: Once | INTRAMUSCULAR | Status: AC
  Administered 2016-10-10: 120 mg via EPIDURAL

## 2016-10-10 MED ORDER — IOPAMIDOL (ISOVUE-M 200) INJECTION 41%
1.0000 mL | Freq: Once | INTRAMUSCULAR | Status: AC
  Administered 2016-10-10: 1 mL via EPIDURAL

## 2016-10-10 NOTE — Discharge Instructions (Signed)

## 2016-10-29 DIAGNOSIS — M5126 Other intervertebral disc displacement, lumbar region: Secondary | ICD-10-CM | POA: Insufficient documentation

## 2017-10-16 ENCOUNTER — Ambulatory Visit (INDEPENDENT_AMBULATORY_CARE_PROVIDER_SITE_OTHER): Payer: 59

## 2017-10-16 ENCOUNTER — Encounter: Payer: Self-pay | Admitting: Podiatry

## 2017-10-16 ENCOUNTER — Ambulatory Visit: Payer: 59 | Admitting: Podiatry

## 2017-10-16 ENCOUNTER — Other Ambulatory Visit: Payer: Self-pay | Admitting: Podiatry

## 2017-10-16 VITALS — BP 110/73 | HR 85

## 2017-10-16 DIAGNOSIS — M779 Enthesopathy, unspecified: Principal | ICD-10-CM

## 2017-10-16 DIAGNOSIS — M7751 Other enthesopathy of right foot: Secondary | ICD-10-CM

## 2017-10-16 DIAGNOSIS — M778 Other enthesopathies, not elsewhere classified: Secondary | ICD-10-CM

## 2017-10-16 MED ORDER — MELOXICAM 15 MG PO TABS: 15.0000 mg | ORAL_TABLET | Freq: Every day | ORAL | 3 refills | Status: DC

## 2017-10-16 MED ORDER — METHYLPREDNISOLONE 4 MG PO TBPK: ORAL_TABLET | ORAL | 0 refills | Status: DC

## 2017-10-16 NOTE — Progress Notes (Signed)
He presents today chief complaint of painful area to the second metatarsophalangeal joint of the right foot.  He states that is exactly the same thing that I had on the other foot and is become annoying over the past month or so.  Denies any trauma.  Objective: Vital signs are stable he is alert and oriented x3.  Pulses are palpable.  Neurologic sensorium is intact though he has diminished sensation per Semmes Weinstein monofilament to the right lower extremity because of sciatica and back issues to the right side.  Muscle strength was 5/5 dorsiflexors plantar flexors inverters everters onto the musculature is intact.  Orthopedic evaluation demonstrates hallux valgus bilateral he has pain on end range of motion of the second metatarsal phalangeal joint dorsally and plantarly.  Radiographs taken today demonstrate hallux valgus slightly elongated second metatarsal no other acute findings.  Assessment: Capsulitis of the second metatarsal phalangeal joint of the right foot.  Plan: Discussed etiology pathology conservative surgical therapy at this point time start him on a Medrol Dosepak to be followed by meloxicam.  Discussed appropriate shoe gear stretching exercise ice therapy and shoe gear modifications.  Also after sterile Betadine skin prep injected 10 mg of Kenalog and 10 mg of Marcaine to the point of maximal tenderness around the second metatarsophalangeal joint of the right foot.  He tolerated procedure well without complications.  Follow-up with him in 1 month.  May need to consider new orthotics.

## 2017-11-03 ENCOUNTER — Ambulatory Visit (INDEPENDENT_AMBULATORY_CARE_PROVIDER_SITE_OTHER): Payer: Self-pay | Admitting: Orthotics

## 2017-11-03 DIAGNOSIS — M779 Enthesopathy, unspecified: Secondary | ICD-10-CM

## 2017-11-03 DIAGNOSIS — M778 Other enthesopathies, not elsewhere classified: Secondary | ICD-10-CM

## 2017-11-03 NOTE — Progress Notes (Signed)
Patient came into today for casting bilateral f/o to address 2nd met capsulitis.  Patient reports history of foot pain involving plantar aponeurosis.  Goal is to provide longitudinal arch support and correct any RF instability due to heel eversion/inversion.  Ultimate goal is to relieve tension at pf insertion calcaneal tuberosity.  Plan on semi-rigid device addressing heel stability and relieving PF tension.

## 2017-11-11 ENCOUNTER — Ambulatory Visit (INDEPENDENT_AMBULATORY_CARE_PROVIDER_SITE_OTHER): Payer: 59 | Admitting: Podiatry

## 2017-11-11 ENCOUNTER — Encounter: Payer: Self-pay | Admitting: Podiatry

## 2017-11-11 DIAGNOSIS — M778 Other enthesopathies, not elsewhere classified: Secondary | ICD-10-CM

## 2017-11-11 DIAGNOSIS — S91311A Laceration without foreign body, right foot, initial encounter: Secondary | ICD-10-CM | POA: Diagnosis not present

## 2017-11-11 DIAGNOSIS — M779 Enthesopathy, unspecified: Principal | ICD-10-CM

## 2017-11-12 NOTE — Progress Notes (Signed)
He presents today for follow-up of capsulitis to the second metatarsal phalangeal joint of the right foot he states that he is feeling a lot better.  He has a small laceration to the posterior aspect of his right heel  Objective: Vital signs are stable he is alert and oriented x3.  Pulses are palpable.  Neurologic sensorium is intact.  DP reflexes are intact.  Much decrease in erythema and edema around the second metatarsophalangeal joint of the right foot no longer tender on palpation and range of motion.  Assessment: Resolving capsulitis.  Plan: He will follow-up with Robert Shah in the next couple of weeks to obtain his orthotics.  They have already been sized and scanned and we are currently waiting on fabrication.

## 2017-12-15 ENCOUNTER — Encounter

## 2017-12-15 ENCOUNTER — Encounter: Payer: 59 | Admitting: Orthotics

## 2018-01-29 DIAGNOSIS — H7291 Unspecified perforation of tympanic membrane, right ear: Secondary | ICD-10-CM | POA: Diagnosis not present

## 2018-01-29 DIAGNOSIS — Z77122 Contact with and (suspected) exposure to noise: Secondary | ICD-10-CM | POA: Diagnosis not present

## 2018-01-29 DIAGNOSIS — H9192 Unspecified hearing loss, left ear: Secondary | ICD-10-CM | POA: Diagnosis not present

## 2018-01-29 DIAGNOSIS — H9071 Mixed conductive and sensorineural hearing loss, unilateral, right ear, with unrestricted hearing on the contralateral side: Secondary | ICD-10-CM | POA: Diagnosis not present

## 2018-02-05 ENCOUNTER — Encounter: Payer: Self-pay | Admitting: Podiatry

## 2018-02-05 ENCOUNTER — Ambulatory Visit: Payer: BLUE CROSS/BLUE SHIELD | Admitting: Podiatry

## 2018-02-05 DIAGNOSIS — M7751 Other enthesopathy of right foot: Secondary | ICD-10-CM

## 2018-02-05 DIAGNOSIS — M779 Enthesopathy, unspecified: Principal | ICD-10-CM

## 2018-02-05 DIAGNOSIS — M778 Other enthesopathies, not elsewhere classified: Secondary | ICD-10-CM

## 2018-02-05 MED ORDER — MELOXICAM 15 MG PO TABS: 15.0000 mg | ORAL_TABLET | Freq: Every day | ORAL | 3 refills | Status: DC

## 2018-02-05 NOTE — Progress Notes (Signed)
He presents today for follow-up of capsulitis second metatarsophalangeal joint of the right foot states that is been hurting a lot.  Was doing very well.  Continues to wear his orthotics while he is at work.  Objective: Lungs understand was alert oriented x3 increasing bunion deformity resulting in plantar flex echo metatarsal and pain around the second metatarsophalangeal joint.  He also has hammertoe deformities rigid in nature.  Assessment: Capsulitis of the second metatarsal phalangeal joint right foot.  Plan: At this point when he had an injection from the plantar aspect of the foot 2 mg of dexamethasone and local anesthetic tolerated procedure well instrument and skin prep I will follow-up with him in a few weeks.

## 2018-02-26 DIAGNOSIS — I10 Essential (primary) hypertension: Secondary | ICD-10-CM | POA: Diagnosis not present

## 2018-02-26 DIAGNOSIS — F419 Anxiety disorder, unspecified: Secondary | ICD-10-CM | POA: Diagnosis not present

## 2018-02-26 DIAGNOSIS — E119 Type 2 diabetes mellitus without complications: Secondary | ICD-10-CM | POA: Diagnosis not present

## 2018-02-26 DIAGNOSIS — Z6827 Body mass index (BMI) 27.0-27.9, adult: Secondary | ICD-10-CM | POA: Diagnosis not present

## 2018-03-10 ENCOUNTER — Ambulatory Visit: Payer: BLUE CROSS/BLUE SHIELD | Admitting: Podiatry

## 2018-03-10 DIAGNOSIS — M779 Enthesopathy, unspecified: Secondary | ICD-10-CM | POA: Diagnosis not present

## 2018-03-10 DIAGNOSIS — M778 Other enthesopathies, not elsewhere classified: Secondary | ICD-10-CM

## 2018-03-11 MED ORDER — DICLOFENAC SODIUM 75 MG PO TBEC: 75.0000 mg | DELAYED_RELEASE_TABLET | Freq: Two times a day (BID) | ORAL | 3 refills | Status: AC

## 2018-03-11 NOTE — Progress Notes (Signed)
He presents today for follow-up of his second metatarsal phalangeal joint of his right foot capsulitis states that is approximately 80% improved.  He still has some numbness and tingling in the area.  Objective: Vital signs are stable he is alert and oriented x3.  Pulses are palpable.  He has some tenderness on direct palpation of the plantar plate area of the right second metatarsal phalangeal joint.  No erythema mild edema no cellulitis drainage or odor.  Mild tenderness on range of motion.  Assessment: Slowly resolving capsulitis second metatarsal phalangeal joint of the right foot.  Plan: Switched him to diclofenac 75 mg 1 p.o. twice daily also injected the area from the plantar aspect today with 2 mg of dexamethasone to the point of maximal tenderness.  He tolerated procedure well and left feeling better.  I also encouraged him to wear his orthotics at all times I will follow-up with him in about 6 weeks or so.

## 2018-04-07 ENCOUNTER — Encounter: Payer: Self-pay | Admitting: Podiatry

## 2018-04-07 ENCOUNTER — Ambulatory Visit: Payer: BLUE CROSS/BLUE SHIELD | Admitting: Podiatry

## 2018-04-07 DIAGNOSIS — M2041 Other hammer toe(s) (acquired), right foot: Secondary | ICD-10-CM

## 2018-04-07 DIAGNOSIS — L600 Ingrowing nail: Secondary | ICD-10-CM | POA: Diagnosis not present

## 2018-04-07 DIAGNOSIS — M2011 Hallux valgus (acquired), right foot: Secondary | ICD-10-CM | POA: Diagnosis not present

## 2018-04-07 DIAGNOSIS — M216X1 Other acquired deformities of right foot: Secondary | ICD-10-CM | POA: Diagnosis not present

## 2018-04-07 NOTE — Patient Instructions (Signed)
Pre-Operative Instructions  Congratulations, you have decided to take an important step towards improving your quality of life.  You can be assured that the doctors and staff at [facility] will be with you every step of the way.  Here are some important things you should know:  1. Plan to be at the surgery center/hospital at least 1 (one) hour prior to your scheduled time, unless otherwise directed by the surgical center/hospital staff.  You must have a responsible adult accompany you, remain during the surgery and drive you home.  Make sure you have directions to the surgical center/hospital to ensure you arrive on time. 2. If you are having surgery at [facility] or [facility], you will need a copy of your medical history and physical form from your family physician within one month prior to the date of surgery. We will give you a form for your primary physician to complete.  3. We make every effort to accommodate the date you request for surgery.  However, there are times where surgery dates or times have to be moved.  We will contact you as soon as possible if a change in schedule is required.   4. No aspirin/ibuprofen for one week before surgery.  If you are on aspirin, any non-steroidal anti-inflammatory medications (Mobic, Aleve, Ibuprofen) should not be taken seven (7) days prior to your surgery.  You make take Tylenol for pain prior to surgery.  5. Medications - If you are taking daily heart and blood pressure medications, seizure, reflux, allergy, asthma, anxiety, pain or diabetes medications, make sure you notify the surgery center/hospital before the day of surgery so they can tell you which medications you should take or avoid the day of surgery. 6. No food or drink after midnight the night before surgery unless directed otherwise by surgical center/hospital staff. 7. No alcoholic beverages 24-hours prior to surgery.  No smoking 24-hours prior or 24-hours after  surgery. 8. Wear loose pants or shorts. They should be loose enough to fit over bandages, boots, and casts. 9. Don't wear slip-on shoes. Sneakers are preferred. 10. Bring your boot with you to the surgery center/hospital.  Also bring crutches or a walker if your physician has prescribed it for you.  If you do not have this equipment, it will be provided for you after surgery. 11. If you have not been contacted by the surgery center/hospital by the day before your surgery, call to confirm the date and time of your surgery. 12. Leave-time from work may vary depending on the type of surgery you have.  Appropriate arrangements should be made prior to surgery with your employer. 13. Prescriptions will be provided immediately following surgery by your doctor.  Fill these as soon as possible after surgery and take the medication as directed. Pain medications will not be refilled on weekends and must be approved by the doctor. 14. Remove nail polish on the operative foot and avoid getting pedicures prior to surgery. 15. Wash the night before surgery.  The night before surgery wash the foot and leg well with water and the antibacterial soap provided. Be sure to pay special attention to beneath the toenails and in between the toes.  Wash for at least three (3) minutes. Rinse thoroughly with water and dry well with a towel.  Perform this wash unless told not to do so by your physician.  Enclosed: 1 Ice pack (please put in freezer the night before surgery)   1 Hibiclens skin cleaner     Pre-op instructions  If you have any questions regarding the instructions, please do not hesitate to call our office.  [place]: [address], [place], [place] [address] -- [phone]  [place]: [address] [place], [place] [address] -- [phone]  [place]: [address]  [place], [place] [address] -- [phone]  [place]: [address], [address], [place], [place] [address] -- [phone]  Website:  [email or web address] 

## 2018-04-08 DIAGNOSIS — M79676 Pain in unspecified toe(s): Secondary | ICD-10-CM

## 2018-04-08 NOTE — Progress Notes (Signed)
He presents today for follow-up of his capsulitis he states that is just not getting any better he states that it hurts up in the toe and the toe contracts a lot at times.  Objective: Vital signs are stable he is alert and oriented x3.  Pulses are palpable.  Hallux abductovalgus deformity is resulted in more pressure being transferred to the second metatarsal phalangeal joint which is resulting in Chronic capsulitis and tendinitis of the long flexor to the toe.  The toe is also fixed and a mild flexor contraction at the PIPJ secondary to osteoarthritic change.  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  Plan: Discussed etiology pathology and surgical therapies at this point time went ahead and consented him for a [place] bunion repair possible Lorenz Coaster arthroplasty with a single silicone implant.  Also discussed the possible need for second metatarsal osteotomy.  Also discussed hammertoe repair with screw.  Also discussed matrixectomy surgical fibular border hallux right.  We discussed the possible postop complications which may include but not limited to postop pain bleeding swelling infection recurrence need further surgery overcorrection under correction also digit loss of limb loss of life.  He understands this is amenable to it.  Dispensed a Cam walker today dispensed information regarding the surgery center preoperative information as well as anesthesia information.  He will follow-up with me in the near future for surgical evaluation.

## 2018-04-16 ENCOUNTER — Telehealth: Payer: Self-pay | Admitting: *Deleted

## 2018-04-16 ENCOUNTER — Other Ambulatory Visit: Payer: Self-pay | Admitting: Podiatry

## 2018-04-16 MED ORDER — CEPHALEXIN 500 MG PO CAPS: 500.0000 mg | ORAL_CAPSULE | Freq: Three times a day (TID) | ORAL | 0 refills | Status: DC

## 2018-04-16 MED ORDER — OXYCODONE-ACETAMINOPHEN 10-325 MG PO TABS: 1.0000 | ORAL_TABLET | Freq: Four times a day (QID) | ORAL | 0 refills | Status: AC | PRN

## 2018-04-16 MED ORDER — ONDANSETRON HCL 4 MG PO TABS: 4.0000 mg | ORAL_TABLET | Freq: Three times a day (TID) | ORAL | 0 refills | Status: DC | PRN

## 2018-04-16 NOTE — Telephone Encounter (Signed)
"  I'm scheduled for surgery tomorrow.  I have a question I need to ask Dr. Al Corpus.  Is he absolutely sure I need the surgery? The reason I ask is that I put on my old shoes and my old orthotics and my foot has felt better this week.  The new shoes and the new orthotics seem to aggravate it.  The foot feels better than it has in a long time.  Does he really think I need the surgery right now or is it something we can hold off on?"  I will ask Dr. Al Corpus and give you a call back.

## 2018-04-16 NOTE — Telephone Encounter (Signed)
You can try your shoes and orthotics for a while and let me know if you decide otherwise.  You will tell me. I don't need to tell you.  If you hurt bad enough you will want surgery and know you need it without question.

## 2018-04-16 NOTE — Telephone Encounter (Signed)
I attempted to call the patient.  I left him a message about Dr. Geryl Rankins response.

## 2018-04-16 NOTE — Telephone Encounter (Signed)
I informed pt of Dr. Geryl Rankins recommendation and pt states he will be at the surgery center tomorrow.

## 2018-04-16 NOTE — Telephone Encounter (Signed)
Left message informing pt of Dr. Geryl Rankins with his recommendation.

## 2018-04-17 ENCOUNTER — Encounter: Payer: Self-pay | Admitting: Podiatry

## 2018-04-17 DIAGNOSIS — L600 Ingrowing nail: Secondary | ICD-10-CM | POA: Diagnosis not present

## 2018-04-17 DIAGNOSIS — M2021 Hallux rigidus, right foot: Secondary | ICD-10-CM | POA: Diagnosis not present

## 2018-04-17 DIAGNOSIS — M2011 Hallux valgus (acquired), right foot: Secondary | ICD-10-CM | POA: Diagnosis not present

## 2018-04-17 DIAGNOSIS — M205X1 Other deformities of toe(s) (acquired), right foot: Secondary | ICD-10-CM | POA: Diagnosis not present

## 2018-04-17 DIAGNOSIS — M21541 Acquired clubfoot, right foot: Secondary | ICD-10-CM | POA: Diagnosis not present

## 2018-04-17 DIAGNOSIS — G4730 Sleep apnea, unspecified: Secondary | ICD-10-CM | POA: Diagnosis not present

## 2018-04-17 DIAGNOSIS — M25571 Pain in right ankle and joints of right foot: Secondary | ICD-10-CM | POA: Diagnosis not present

## 2018-04-17 DIAGNOSIS — M216X1 Other acquired deformities of right foot: Secondary | ICD-10-CM | POA: Diagnosis not present

## 2018-04-17 DIAGNOSIS — M2041 Other hammer toe(s) (acquired), right foot: Secondary | ICD-10-CM | POA: Diagnosis not present

## 2018-04-23 ENCOUNTER — Ambulatory Visit (INDEPENDENT_AMBULATORY_CARE_PROVIDER_SITE_OTHER): Payer: BLUE CROSS/BLUE SHIELD

## 2018-04-23 ENCOUNTER — Encounter: Payer: Self-pay | Admitting: Podiatry

## 2018-04-23 ENCOUNTER — Ambulatory Visit (INDEPENDENT_AMBULATORY_CARE_PROVIDER_SITE_OTHER): Payer: Self-pay | Admitting: Podiatry

## 2018-04-23 VITALS — Temp 98.6°F

## 2018-04-23 DIAGNOSIS — M2041 Other hammer toe(s) (acquired), right foot: Secondary | ICD-10-CM

## 2018-04-23 DIAGNOSIS — Z09 Encounter for follow-up examination after completed treatment for conditions other than malignant neoplasm: Secondary | ICD-10-CM | POA: Diagnosis not present

## 2018-04-23 DIAGNOSIS — M216X1 Other acquired deformities of right foot: Secondary | ICD-10-CM | POA: Diagnosis not present

## 2018-04-23 DIAGNOSIS — M2011 Hallux valgus (acquired), right foot: Secondary | ICD-10-CM

## 2018-04-23 NOTE — Progress Notes (Signed)
Subjective:   Patient ID: Robert Shah, male   DOB: 48 y.o.   MRN: [id number]   HPI Patient presents stating I am doing pretty well overall with mild discomfort and swelling   ROS      Objective:  Physical Exam  Neurovascular status intact negative Homans sign noted with patient's right first metatarsal and second metatarsal digit in good alignment with wound edges well coapted and good current range of motion of the first MPJ.  Patient was noted to have good digital perfusion     Assessment:  Doing well post forefoot surgery by Dr. Maryland Pink 1 week ago     Plan:  H&P condition reviewed and I went ahead and reviewed x-ray with patient.  Reapplied sterile dressing continued immobilization elevation compression and reappoint 1 week or earlier if any issues were to occur  X-rays indicate that there is good alignment of the first MPJ with implant intact and grommets in place with second digit and alignment with fixation in place second digit second metatarsal

## 2018-05-05 ENCOUNTER — Ambulatory Visit (INDEPENDENT_AMBULATORY_CARE_PROVIDER_SITE_OTHER): Payer: BLUE CROSS/BLUE SHIELD | Admitting: Podiatry

## 2018-05-05 DIAGNOSIS — M216X1 Other acquired deformities of right foot: Secondary | ICD-10-CM

## 2018-05-05 DIAGNOSIS — M2041 Other hammer toe(s) (acquired), right foot: Secondary | ICD-10-CM

## 2018-05-05 DIAGNOSIS — M2011 Hallux valgus (acquired), right foot: Secondary | ICD-10-CM | POA: Diagnosis not present

## 2018-05-05 DIAGNOSIS — Z09 Encounter for follow-up examination after completed treatment for conditions other than malignant neoplasm: Secondary | ICD-10-CM

## 2018-05-06 NOTE — Progress Notes (Signed)
He presents today date of surgery 04/17/2018 [facility] bunionectomy right foot possible Keller implant right hammertoe repair second right and excision of nail permanent hallux right.  He states that is still sore continues to use his crutches and his cam walker.  States that he is not putting weight on the forefoot just on the heel.  States that he is already been out in his yard working as he can stand sitting around.  Objective: Vital signs are stable he is alert and oriented x3.  Pulses are palpable.  Minimal edema about the foot sutures are intact margins well coapted incision sites are going to heal uneventfully.  There is no open lesions or wounds he has tenderness on range of motion of the first and second metatarsophalangeal joints primarily because he has not been using this.  He has not even been moving them.  Assessment: Well-healing surgical foot.  Plan: We placed him in a Darco shoe and a compression anklet I encouraged him to start ambulating utilizing a shoe and moving his toes.  This should help alleviate some of the stiffness and soreness in the toes as time goes on.  Or follow-up with him in a couple weeks.

## 2018-05-19 ENCOUNTER — Ambulatory Visit (INDEPENDENT_AMBULATORY_CARE_PROVIDER_SITE_OTHER): Payer: BLUE CROSS/BLUE SHIELD | Admitting: Podiatry

## 2018-05-19 ENCOUNTER — Encounter: Payer: Self-pay | Admitting: Podiatry

## 2018-05-19 ENCOUNTER — Other Ambulatory Visit: Payer: Self-pay

## 2018-05-19 ENCOUNTER — Ambulatory Visit (INDEPENDENT_AMBULATORY_CARE_PROVIDER_SITE_OTHER): Payer: BLUE CROSS/BLUE SHIELD

## 2018-05-19 DIAGNOSIS — M2011 Hallux valgus (acquired), right foot: Secondary | ICD-10-CM | POA: Diagnosis not present

## 2018-05-19 DIAGNOSIS — M2041 Other hammer toe(s) (acquired), right foot: Secondary | ICD-10-CM

## 2018-05-19 DIAGNOSIS — M216X1 Other acquired deformities of right foot: Secondary | ICD-10-CM

## 2018-05-19 DIAGNOSIS — Z09 Encounter for follow-up examination after completed treatment for conditions other than malignant neoplasm: Secondary | ICD-10-CM

## 2018-05-19 NOTE — Progress Notes (Signed)
He presents today for postop visit he is coming up a week 5 date of surgery 04/17/2018 status post Lorenz Coaster arthroplasty with a single silicone implant hammertoe repair with screw and the second metatarsal osteotomy on the right foot.  Denies fever chills nausea vomiting muscle aches and pains.  Dates that he has some mild dehiscence of the proximal aspect of the wound.  Otherwise he says it swells during the day but goes down at nighttime.  Unable to move my toes little bit more as he demonstrates motion in his toes.  Denies fever chills nausea vomiting muscle aches pains headache calf pain back pain chest pain and shortness of breath.  Objective: Vital signs are stable alert oriented x3.  Pulses are palpable.  Foot has appeared to be doing much better with much decrease in edema incision site is healing very nicely there is no open wound that I can see at this point.  He has good range of motion of the toes.  States that he still has tender on the plantar aspect of the metatarsal phalangeal joint.  Assessment: Well-healing surgical foot.  Plan: Encourage range of motion exercises recommended that he wear his Darco for at least 1 more week for week and then try to transition to a tennis shoe but no bare feet.  He understands that is amenable to it we will follow-up with me in 2 weeks.

## 2018-06-02 ENCOUNTER — Ambulatory Visit (INDEPENDENT_AMBULATORY_CARE_PROVIDER_SITE_OTHER): Payer: BLUE CROSS/BLUE SHIELD | Admitting: Podiatry

## 2018-06-02 ENCOUNTER — Other Ambulatory Visit: Payer: Self-pay

## 2018-06-02 ENCOUNTER — Encounter: Payer: Self-pay | Admitting: Podiatry

## 2018-06-02 ENCOUNTER — Ambulatory Visit (INDEPENDENT_AMBULATORY_CARE_PROVIDER_SITE_OTHER): Payer: BLUE CROSS/BLUE SHIELD

## 2018-06-02 VITALS — Temp 97.5°F

## 2018-06-02 DIAGNOSIS — Z9889 Other specified postprocedural states: Secondary | ICD-10-CM

## 2018-06-02 DIAGNOSIS — M2041 Other hammer toe(s) (acquired), right foot: Secondary | ICD-10-CM | POA: Diagnosis not present

## 2018-06-02 DIAGNOSIS — M216X1 Other acquired deformities of right foot: Secondary | ICD-10-CM | POA: Diagnosis not present

## 2018-06-02 DIAGNOSIS — L600 Ingrowing nail: Secondary | ICD-10-CM

## 2018-06-02 DIAGNOSIS — M2011 Hallux valgus (acquired), right foot: Secondary | ICD-10-CM

## 2018-06-02 NOTE — Progress Notes (Signed)
He presents today date of surgery April 17, 2018 status post [facility] arthroplasty with single silicone implant second metatarsal osteotomy and hammertoe repair second right.  He denies fever chills nausea vomiting muscle aches pain states seems to be getting some better.  Objective: Vital signs are stable he is alert and oriented x3.  Pulses are palpable.  Still some swelling to the first interdigital space is good range of motion of the first metatarsophalangeal joint as well as the second metatarsal phalangeal joint.  Assessment: Well-healing surgical foot.  Plan: I encouraged him to continue all conservative therapies we will follow-up with him in about 3 weeks at which time we will reassess as to whether or not we can get back to work.

## 2018-06-23 ENCOUNTER — Other Ambulatory Visit: Payer: Self-pay

## 2018-06-23 ENCOUNTER — Ambulatory Visit (INDEPENDENT_AMBULATORY_CARE_PROVIDER_SITE_OTHER): Payer: BLUE CROSS/BLUE SHIELD

## 2018-06-23 ENCOUNTER — Ambulatory Visit (INDEPENDENT_AMBULATORY_CARE_PROVIDER_SITE_OTHER): Payer: BLUE CROSS/BLUE SHIELD | Admitting: Podiatry

## 2018-06-23 ENCOUNTER — Encounter: Payer: Self-pay | Admitting: Podiatry

## 2018-06-23 VITALS — Temp 98.1°F

## 2018-06-23 DIAGNOSIS — M2011 Hallux valgus (acquired), right foot: Secondary | ICD-10-CM

## 2018-06-23 DIAGNOSIS — M216X1 Other acquired deformities of right foot: Secondary | ICD-10-CM

## 2018-06-23 DIAGNOSIS — M2041 Other hammer toe(s) (acquired), right foot: Secondary | ICD-10-CM

## 2018-06-23 DIAGNOSIS — L600 Ingrowing nail: Secondary | ICD-10-CM

## 2018-06-23 DIAGNOSIS — Z9889 Other specified postprocedural states: Secondary | ICD-10-CM

## 2018-06-23 NOTE — Progress Notes (Signed)
He presents today for postop visit date of surgery April 17, 2018 status post Robert Shah bunionectomy right with a second metatarsal osteotomy and hammertoe repair second right both with screws.  He states that he is doing much better seems to be improving some days are worse than others.  Objective: Vital signs are stable he is alert and oriented x3.  There is no erythema edema cellulitis drainage or odor.  He has great range of motion of the first metatarsal phalangeal joint of the right foot and good range of motion of the second metatarsal phalangeal joint.  Radiographs taken today do demonstrate what appears to be healing osteotomies in good position of the first metatarsal phalangeal joint with single silicone implant.  Assessment: Well-healing surgical foot.  Plan: I would like to follow-up with him in 3 weeks prior to releasing him for work.

## 2018-07-14 ENCOUNTER — Encounter: Payer: Self-pay | Admitting: Podiatry

## 2018-07-14 ENCOUNTER — Ambulatory Visit (INDEPENDENT_AMBULATORY_CARE_PROVIDER_SITE_OTHER): Payer: BLUE CROSS/BLUE SHIELD | Admitting: Podiatry

## 2018-07-14 ENCOUNTER — Ambulatory Visit (INDEPENDENT_AMBULATORY_CARE_PROVIDER_SITE_OTHER): Payer: BLUE CROSS/BLUE SHIELD

## 2018-07-14 ENCOUNTER — Other Ambulatory Visit: Payer: Self-pay

## 2018-07-14 VITALS — Temp 97.3°F

## 2018-07-14 DIAGNOSIS — L600 Ingrowing nail: Secondary | ICD-10-CM

## 2018-07-14 DIAGNOSIS — M2041 Other hammer toe(s) (acquired), right foot: Secondary | ICD-10-CM

## 2018-07-14 DIAGNOSIS — M216X1 Other acquired deformities of right foot: Secondary | ICD-10-CM

## 2018-07-14 DIAGNOSIS — Z9889 Other specified postprocedural states: Secondary | ICD-10-CM

## 2018-07-14 DIAGNOSIS — M2011 Hallux valgus (acquired), right foot: Secondary | ICD-10-CM

## 2018-07-15 NOTE — Progress Notes (Signed)
He presents today for postop visit date of surgery 04/17/2018 status post Keller arthroplasty with single silicone implant and a second met osteotomy with hammertoe repair.  States that is still stiff and hard to bend good.  He states that I typically just go out and do what I want to do and it will swell up and get a painful sore to prop it up.  He denies any trauma to the foot.  He states that he is ready to get back to work.  Objective: Vital signs are stable he is alert and oriented x3.  Pulses are palpable.  There is no erythema moderate edema to the intermetatarsal space no cellulitis drainage or odor.  He has great range of motion at the second metatarsal phalangeal joint slightly stiff at the first metatarsal phalangeal joint with scar tissue seems to be subsiding.  Radiographs demonstrate good internal fixation of the second toe and the second metatarsal as well as good placement of an arthroplasty and implant.  Assessment: Slowly healing surgical foot right.  Plan: I will allow him to get back to work and I expressed to him that he should have great days and bad days with this he is going have some days that are more swollen than others he understands and is amenable to it and will follow-up with me in a few weeks. 

## 2018-08-25 ENCOUNTER — Encounter: Payer: Self-pay | Admitting: Podiatry

## 2018-08-25 ENCOUNTER — Ambulatory Visit (INDEPENDENT_AMBULATORY_CARE_PROVIDER_SITE_OTHER): Payer: BC Managed Care – PPO | Admitting: Podiatry

## 2018-08-25 ENCOUNTER — Ambulatory Visit (INDEPENDENT_AMBULATORY_CARE_PROVIDER_SITE_OTHER): Payer: BC Managed Care – PPO

## 2018-08-25 ENCOUNTER — Other Ambulatory Visit: Payer: Self-pay

## 2018-08-25 VITALS — Temp 98.7°F

## 2018-08-25 DIAGNOSIS — M2011 Hallux valgus (acquired), right foot: Secondary | ICD-10-CM

## 2018-08-25 DIAGNOSIS — Z9889 Other specified postprocedural states: Secondary | ICD-10-CM | POA: Diagnosis not present

## 2018-08-25 DIAGNOSIS — M216X1 Other acquired deformities of right foot: Secondary | ICD-10-CM

## 2018-08-25 DIAGNOSIS — M2041 Other hammer toe(s) (acquired), right foot: Secondary | ICD-10-CM

## 2018-08-25 NOTE — Progress Notes (Signed)
He presents today date of surgery April 17, 2018 status post Robert Shah arthroplasty Robert Shah single silicone implant second metatarsal osteotomy and hammertoe repair second right with screw.  He states that sometimes I feel like I can feel the screws in the air and my foot is really not working right and is still painful sometimes.  Objective: Vital signs are stable he is alert and oriented x3.  Pulses are palpable.  No edema today.  He has restricted range of motion of the first metatarsophalangeal joint though it is just stiff and will move but he really has to get into some therapy or start being more aggressive with that motion.  No reproducible pain on palpation of the second metatarsal head or joint.  Assessment: Slowly healing surgical foot right.  Plan: He comes in with get another set of x-rays see if we need to take any of the internal fixation out I will follow-up with him in a couple of months.

## 2018-10-19 DIAGNOSIS — H02811 Retained foreign body in right upper eyelid: Secondary | ICD-10-CM | POA: Diagnosis not present

## 2018-10-27 ENCOUNTER — Encounter: Payer: Self-pay | Admitting: Podiatry

## 2018-10-27 ENCOUNTER — Other Ambulatory Visit: Payer: Self-pay

## 2018-10-27 ENCOUNTER — Ambulatory Visit (INDEPENDENT_AMBULATORY_CARE_PROVIDER_SITE_OTHER): Payer: BC Managed Care – PPO

## 2018-10-27 ENCOUNTER — Ambulatory Visit (INDEPENDENT_AMBULATORY_CARE_PROVIDER_SITE_OTHER): Payer: BC Managed Care – PPO | Admitting: Podiatry

## 2018-10-27 DIAGNOSIS — Z9889 Other specified postprocedural states: Secondary | ICD-10-CM

## 2018-10-27 DIAGNOSIS — M2011 Hallux valgus (acquired), right foot: Secondary | ICD-10-CM

## 2018-10-27 DIAGNOSIS — M2041 Other hammer toe(s) (acquired), right foot: Secondary | ICD-10-CM

## 2018-10-27 DIAGNOSIS — M216X1 Other acquired deformities of right foot: Secondary | ICD-10-CM | POA: Diagnosis not present

## 2018-10-27 DIAGNOSIS — L600 Ingrowing nail: Secondary | ICD-10-CM

## 2018-10-28 ENCOUNTER — Telehealth: Payer: Self-pay | Admitting: *Deleted

## 2018-10-28 DIAGNOSIS — M2011 Hallux valgus (acquired), right foot: Secondary | ICD-10-CM

## 2018-10-28 DIAGNOSIS — M2041 Other hammer toe(s) (acquired), right foot: Secondary | ICD-10-CM

## 2018-10-28 DIAGNOSIS — M216X1 Other acquired deformities of right foot: Secondary | ICD-10-CM

## 2018-10-28 DIAGNOSIS — Z9889 Other specified postprocedural states: Secondary | ICD-10-CM

## 2018-10-28 NOTE — Telephone Encounter (Signed)
-----  Message from Rip Harbour, [facility] sent at 10/27/2018  4:45 PM EDT ----- Regarding: PT PT referral - [facility] in [place]   S/p Jake Michaelis arthroplasty, met osteo, hammer toe right   Evaluate and treat - increase ROM, decrease pain and swelling  Duration: 3 x weeks x 4 weeks

## 2018-10-28 NOTE — Progress Notes (Signed)
He presents today status post Keller arthroplasty with single silicone implant hammertoe repair second right with a second metatarsal osteotomy.  He states that at least he is able to stand on his right foot now he states that he has not been able to do that.  He does relate that he is walking laterally because he still has pain of the first and second metatarsal phalangeal joints.  He states that he has some days where the foot is incredibly swollen.  Objective: Vital signs are stable he is alert oriented x3.  Pulses are palpable.  He has no swelling today good range of motion dorsiflexion plantarflexion of the first metatarsophalangeal joint and second metatarsal phalangeal joint though it is somewhat restricted due to scar tissue and some deep edema.  Radiographs taken today do not demonstrate any type of osseous abnormalities internal fixation is in good position without motion.  Assessment: Well-healing surgical foot for the most part though he does have some residual pain.  Plan: I have been trying to get him into physical therapy for quite some time at this point he has decided to go to physical therapy.  I will follow-up with him in the near future for reevaluation.

## 2018-10-28 NOTE — Telephone Encounter (Signed)
Faxed required form, orders and demographics to [facility], [address] [place], [place] [address].

## 2018-10-29 DIAGNOSIS — M2560 Stiffness of unspecified joint, not elsewhere classified: Secondary | ICD-10-CM | POA: Diagnosis not present

## 2018-10-29 DIAGNOSIS — M79674 Pain in right toe(s): Secondary | ICD-10-CM | POA: Diagnosis not present

## 2018-10-29 DIAGNOSIS — M6281 Muscle weakness (generalized): Secondary | ICD-10-CM | POA: Diagnosis not present

## 2018-11-05 DIAGNOSIS — M2560 Stiffness of unspecified joint, not elsewhere classified: Secondary | ICD-10-CM | POA: Diagnosis not present

## 2018-11-05 DIAGNOSIS — M6281 Muscle weakness (generalized): Secondary | ICD-10-CM | POA: Diagnosis not present

## 2018-11-05 DIAGNOSIS — M79674 Pain in right toe(s): Secondary | ICD-10-CM | POA: Diagnosis not present

## 2018-11-11 DIAGNOSIS — M6281 Muscle weakness (generalized): Secondary | ICD-10-CM | POA: Diagnosis not present

## 2018-11-11 DIAGNOSIS — M2560 Stiffness of unspecified joint, not elsewhere classified: Secondary | ICD-10-CM | POA: Diagnosis not present

## 2018-11-11 DIAGNOSIS — M79674 Pain in right toe(s): Secondary | ICD-10-CM | POA: Diagnosis not present

## 2018-11-16 DIAGNOSIS — M6281 Muscle weakness (generalized): Secondary | ICD-10-CM | POA: Diagnosis not present

## 2018-11-16 DIAGNOSIS — M79674 Pain in right toe(s): Secondary | ICD-10-CM | POA: Diagnosis not present

## 2018-11-16 DIAGNOSIS — M2560 Stiffness of unspecified joint, not elsewhere classified: Secondary | ICD-10-CM | POA: Diagnosis not present

## 2018-11-23 DIAGNOSIS — M2560 Stiffness of unspecified joint, not elsewhere classified: Secondary | ICD-10-CM | POA: Diagnosis not present

## 2018-11-23 DIAGNOSIS — M79674 Pain in right toe(s): Secondary | ICD-10-CM | POA: Diagnosis not present

## 2018-11-23 DIAGNOSIS — M6281 Muscle weakness (generalized): Secondary | ICD-10-CM | POA: Diagnosis not present

## 2018-12-01 DIAGNOSIS — M79674 Pain in right toe(s): Secondary | ICD-10-CM | POA: Diagnosis not present

## 2018-12-01 DIAGNOSIS — M6281 Muscle weakness (generalized): Secondary | ICD-10-CM | POA: Diagnosis not present

## 2018-12-01 DIAGNOSIS — M2560 Stiffness of unspecified joint, not elsewhere classified: Secondary | ICD-10-CM | POA: Diagnosis not present

## 2018-12-09 DIAGNOSIS — M79674 Pain in right toe(s): Secondary | ICD-10-CM | POA: Diagnosis not present

## 2018-12-09 DIAGNOSIS — M6281 Muscle weakness (generalized): Secondary | ICD-10-CM | POA: Diagnosis not present

## 2018-12-09 DIAGNOSIS — M2560 Stiffness of unspecified joint, not elsewhere classified: Secondary | ICD-10-CM | POA: Diagnosis not present

## 2019-03-18 ENCOUNTER — Ambulatory Visit: Payer: BC Managed Care – PPO | Admitting: Podiatry

## 2019-03-19 DIAGNOSIS — R05 Cough: Secondary | ICD-10-CM | POA: Diagnosis not present

## 2019-03-19 DIAGNOSIS — Z20822 Contact with and (suspected) exposure to covid-19: Secondary | ICD-10-CM | POA: Diagnosis not present

## 2019-04-08 ENCOUNTER — Ambulatory Visit (INDEPENDENT_AMBULATORY_CARE_PROVIDER_SITE_OTHER): Payer: BC Managed Care – PPO | Admitting: Podiatry

## 2019-04-08 ENCOUNTER — Ambulatory Visit (INDEPENDENT_AMBULATORY_CARE_PROVIDER_SITE_OTHER): Payer: BC Managed Care – PPO

## 2019-04-08 ENCOUNTER — Encounter: Payer: Self-pay | Admitting: Podiatry

## 2019-04-08 ENCOUNTER — Other Ambulatory Visit: Payer: Self-pay

## 2019-04-08 DIAGNOSIS — T847XXA Infection and inflammatory reaction due to other internal orthopedic prosthetic devices, implants and grafts, initial encounter: Secondary | ICD-10-CM | POA: Diagnosis not present

## 2019-04-08 DIAGNOSIS — M778 Other enthesopathies, not elsewhere classified: Secondary | ICD-10-CM

## 2019-04-08 NOTE — Patient Instructions (Signed)
Pre-Operative Instructions  Congratulations, you have decided to take an important step towards improving your quality of life.  You can be assured that the doctors and staff at [facility] will be with you every step of the way.  Here are some important things you should know:  1. Plan to be at the surgery center/hospital at least 1 (one) hour prior to your scheduled time, unless otherwise directed by the surgical center/hospital staff.  You must have a responsible adult accompany you, remain during the surgery and drive you home.  Make sure you have directions to the surgical center/hospital to ensure you arrive on time. 2. If you are having surgery at [facility] or [facility], you will need a copy of your medical history and physical form from your family physician within one month prior to the date of surgery. We will give you a form for your primary physician to complete.  3. We make every effort to accommodate the date you request for surgery.  However, there are times where surgery dates or times have to be moved.  We will contact you as soon as possible if a change in schedule is required.   4. No aspirin/ibuprofen for one week before surgery.  If you are on aspirin, any non-steroidal anti-inflammatory medications (Mobic, Aleve, Ibuprofen) should not be taken seven (7) days prior to your surgery.  You make take Tylenol for pain prior to surgery.  5. Medications - If you are taking daily heart and blood pressure medications, seizure, reflux, allergy, asthma, anxiety, pain or diabetes medications, make sure you notify the surgery center/hospital before the day of surgery so they can tell you which medications you should take or avoid the day of surgery. 6. No food or drink after midnight the night before surgery unless directed otherwise by surgical center/hospital staff. 7. No alcoholic beverages 24-hours prior to surgery.  No smoking 24-hours prior or 24-hours after  surgery. 8. Wear loose pants or shorts. They should be loose enough to fit over bandages, boots, and casts. 9. Don't wear slip-on shoes. Sneakers are preferred. 10. Bring your boot with you to the surgery center/hospital.  Also bring crutches or a walker if your physician has prescribed it for you.  If you do not have this equipment, it will be provided for you after surgery. 11. If you have not been contacted by the surgery center/hospital by the day before your surgery, call to confirm the date and time of your surgery. 12. Leave-time from work may vary depending on the type of surgery you have.  Appropriate arrangements should be made prior to surgery with your employer. 13. Prescriptions will be provided immediately following surgery by your doctor.  Fill these as soon as possible after surgery and take the medication as directed. Pain medications will not be refilled on weekends and must be approved by the doctor. 14. Remove nail polish on the operative foot and avoid getting pedicures prior to surgery. 15. Wash the night before surgery.  The night before surgery wash the foot and leg well with water and the antibacterial soap provided. Be sure to pay special attention to beneath the toenails and in between the toes.  Wash for at least three (3) minutes. Rinse thoroughly with water and dry well with a towel.  Perform this wash unless told not to do so by your physician.  Enclosed: 1 Ice pack (please put in freezer the night before surgery)   1 Hibiclens skin cleaner     Pre-op instructions  If you have any questions regarding the instructions, please do not hesitate to call our office.  [facility]: [address], [place], [place] [address] -- [phone]  [facility]: [address] , [place] [address] -- [phone]  [place]: [address], [place], [place] [address] -- [phone]   Website: [email or web address] 

## 2019-04-08 NOTE — Progress Notes (Signed)
He presents today for follow-up of his second metatarsophalangeal joint of his right foot states that second toe and the second metatarsophalangeal joint area is still painfully numb on the bottom.  He states that is numb around the first metatarsophalangeal joint extending to the third metatarsophalangeal joint in a circular pattern.  He states is only numb plantarly not dorsally.  He states that there is really been no change in his past medical history other than his alcohol intake has increased to about 12 beers a day.  Objective: Vital signs are stable alert and oriented x3.  Pulses are palpable.  He has good range of motion of the first metatarsophalangeal joint where Keller arthroplasty was performed he is somewhat limited range of motion of the second metatarsophalangeal joint possibly associated with painful internal fixation.  Radiographs taken today demonstrate 2 screws to the head of the second metatarsal as well as a screw to the second toe.  Assessment: Painful internal fixation with some neuritis to the plantar forefoot that we may have to examine with a nerve conduction velocity exam.  I am not sure why is just localized in that forefoot area but it should not be even remotely associated with the surgical procedures.  Plan: Consented him today for removal of internal fixation second toe and second metatarsal of the right foot.  Signedthe consent form follow-up with him in the near future for surgical intervention.

## 2019-04-09 ENCOUNTER — Telehealth: Payer: Self-pay | Admitting: Podiatry

## 2019-04-09 NOTE — Telephone Encounter (Signed)
DOS: 05/07/2019  SURGICAL PROCEDURE: Removal Fixation Screw x2 2nd Metatarsal and x1 2nd Toe [phone]).  BCBS Policy Effective : 01/26/2019  -  02/24/2198  Member Liability Summary  In-Network   Max Per Benefit Period Year-to-Date Remaining     CoInsurance 30%       Deductible  $3500.00 $3500.00     Out-Of-Pocket 3 $7000.00 $6975.00 3 Out-of-Pocket includes copay, deductible, and coinsurance.  [facility]  In Network Copay Coinsurance   Authorization Required Not Applicable 30%  per  Service Year No

## 2019-05-06 ENCOUNTER — Other Ambulatory Visit: Payer: Self-pay | Admitting: Podiatry

## 2019-05-06 MED ORDER — CLINDAMYCIN HCL 150 MG PO CAPS: 150.0000 mg | ORAL_CAPSULE | Freq: Three times a day (TID) | ORAL | 0 refills | Status: DC

## 2019-05-06 MED ORDER — OXYCODONE-ACETAMINOPHEN 10-325 MG PO TABS: 1.0000 | ORAL_TABLET | Freq: Four times a day (QID) | ORAL | 0 refills | Status: AC | PRN

## 2019-05-07 ENCOUNTER — Encounter: Payer: Self-pay | Admitting: Podiatry

## 2019-05-07 DIAGNOSIS — Z4889 Encounter for other specified surgical aftercare: Secondary | ICD-10-CM | POA: Diagnosis not present

## 2019-05-07 DIAGNOSIS — G4730 Sleep apnea, unspecified: Secondary | ICD-10-CM | POA: Diagnosis not present

## 2019-05-07 DIAGNOSIS — T8484XA Pain due to internal orthopedic prosthetic devices, implants and grafts, initial encounter: Secondary | ICD-10-CM | POA: Diagnosis not present

## 2019-05-13 ENCOUNTER — Ambulatory Visit (INDEPENDENT_AMBULATORY_CARE_PROVIDER_SITE_OTHER): Payer: BC Managed Care – PPO | Admitting: Podiatry

## 2019-05-13 ENCOUNTER — Ambulatory Visit (INDEPENDENT_AMBULATORY_CARE_PROVIDER_SITE_OTHER): Payer: BC Managed Care – PPO

## 2019-05-13 ENCOUNTER — Other Ambulatory Visit: Payer: Self-pay

## 2019-05-13 DIAGNOSIS — M79671 Pain in right foot: Secondary | ICD-10-CM | POA: Diagnosis not present

## 2019-05-13 DIAGNOSIS — Z9889 Other specified postprocedural states: Secondary | ICD-10-CM

## 2019-05-13 DIAGNOSIS — T847XXD Infection and inflammatory reaction due to other internal orthopedic prosthetic devices, implants and grafts, subsequent encounter: Secondary | ICD-10-CM

## 2019-05-13 NOTE — Progress Notes (Signed)
He presents today date of surgery 05/07/2019 status post removal screw fixation x2 and removal screw second toe right foot.  He states that he is been feeling pretty good.  He states that it feels like him not walking on that nail anymore.  Objective: Vital signs are stable he is alert and oriented x3.  There is minimal edema no erythema cellulitis drainage or odor sutures are intact margins are well coapted radiographs demonstrate complete removal of internal fixation.  Assessment: Well-healing surgical foot.  Plan: Redressed today dressed a compressive dressing follow-up with him in 1 week for suture removal.

## 2019-05-20 ENCOUNTER — Other Ambulatory Visit: Payer: Self-pay

## 2019-05-20 ENCOUNTER — Ambulatory Visit (INDEPENDENT_AMBULATORY_CARE_PROVIDER_SITE_OTHER): Payer: BC Managed Care – PPO | Admitting: Podiatry

## 2019-05-20 ENCOUNTER — Encounter: Payer: Self-pay | Admitting: Podiatry

## 2019-05-20 DIAGNOSIS — Z9889 Other specified postprocedural states: Secondary | ICD-10-CM

## 2019-05-20 DIAGNOSIS — T847XXD Infection and inflammatory reaction due to other internal orthopedic prosthetic devices, implants and grafts, subsequent encounter: Secondary | ICD-10-CM

## 2019-05-20 NOTE — Progress Notes (Signed)
He presents today for second postop visit date of surgery 05/07/2019 removal screw second toe right foot and second metatarsal osteotomy screw was removed right foot.  He states that he is doing very well has no problems whatsoever he says it feels like his 100% better he says most of an screw sticking into me.  He denies fever chills nausea vomiting muscle aches pains calf pain back pain chest pain shortness of breath.  Objective: Vital signs are stable he is alert oriented x3 margins well coapted sutures were removed today.  There is no purulence no malodor no signs of infection.  Assessment: Well-healing surgical foot.  Plan: We will let him get back to work next week.  Follow-up with me as needed.

## 2019-06-03 ENCOUNTER — Encounter: Payer: BC Managed Care – PPO | Admitting: Podiatry

## 2019-06-08 ENCOUNTER — Ambulatory Visit (INDEPENDENT_AMBULATORY_CARE_PROVIDER_SITE_OTHER): Payer: BC Managed Care – PPO | Admitting: Podiatry

## 2019-06-08 ENCOUNTER — Other Ambulatory Visit: Payer: Self-pay

## 2019-06-08 DIAGNOSIS — T847XXD Infection and inflammatory reaction due to other internal orthopedic prosthetic devices, implants and grafts, subsequent encounter: Secondary | ICD-10-CM

## 2019-06-08 DIAGNOSIS — Z9889 Other specified postprocedural states: Secondary | ICD-10-CM

## 2019-06-08 NOTE — Progress Notes (Signed)
He presents today date of surgery 05/07/2019 removal screw fixation x2 at the second metatarsal and times one of the second toe.  States that it is healing well without any concerns does have occasional swelling but all in all feels much better since he is not walking on the screws he says.  Objective: Vital signs are stable he is alert and oriented x3 there is no erythema edema cellulitis drainage or odor he has great range of motion of the surgical sites.  They have gone on to heal uneventfully.  Assessment: Well-healing surgical foot.  Plan: Follow-up with me as needed

## 2019-06-17 ENCOUNTER — Encounter: Payer: BC Managed Care – PPO | Admitting: Podiatry

## 2019-08-18 DIAGNOSIS — L97529 Non-pressure chronic ulcer of other part of left foot with unspecified severity: Secondary | ICD-10-CM | POA: Diagnosis not present

## 2019-08-18 DIAGNOSIS — R05 Cough: Secondary | ICD-10-CM | POA: Diagnosis not present

## 2019-08-18 DIAGNOSIS — E119 Type 2 diabetes mellitus without complications: Secondary | ICD-10-CM | POA: Diagnosis not present

## 2019-08-18 DIAGNOSIS — E11621 Type 2 diabetes mellitus with foot ulcer: Secondary | ICD-10-CM | POA: Diagnosis not present

## 2019-09-02 DIAGNOSIS — Z6832 Body mass index (BMI) 32.0-32.9, adult: Secondary | ICD-10-CM | POA: Diagnosis not present

## 2019-09-02 DIAGNOSIS — Z1331 Encounter for screening for depression: Secondary | ICD-10-CM | POA: Diagnosis not present

## 2019-09-02 DIAGNOSIS — E11621 Type 2 diabetes mellitus with foot ulcer: Secondary | ICD-10-CM | POA: Diagnosis not present

## 2019-09-02 DIAGNOSIS — L97529 Non-pressure chronic ulcer of other part of left foot with unspecified severity: Secondary | ICD-10-CM | POA: Diagnosis not present

## 2019-09-02 DIAGNOSIS — R05 Cough: Secondary | ICD-10-CM | POA: Diagnosis not present

## 2019-11-25 DIAGNOSIS — Z20822 Contact with and (suspected) exposure to covid-19: Secondary | ICD-10-CM | POA: Diagnosis not present

## 2019-11-30 DIAGNOSIS — Z6830 Body mass index (BMI) 30.0-30.9, adult: Secondary | ICD-10-CM | POA: Diagnosis not present

## 2019-11-30 DIAGNOSIS — L600 Ingrowing nail: Secondary | ICD-10-CM | POA: Diagnosis not present

## 2019-12-22 DIAGNOSIS — Z6831 Body mass index (BMI) 31.0-31.9, adult: Secondary | ICD-10-CM | POA: Diagnosis not present

## 2019-12-22 DIAGNOSIS — I10 Essential (primary) hypertension: Secondary | ICD-10-CM | POA: Diagnosis not present

## 2019-12-22 DIAGNOSIS — S8011XA Contusion of right lower leg, initial encounter: Secondary | ICD-10-CM | POA: Diagnosis not present

## 2020-02-04 DIAGNOSIS — Z20822 Contact with and (suspected) exposure to covid-19: Secondary | ICD-10-CM | POA: Diagnosis not present

## 2020-02-18 ENCOUNTER — Emergency Department
Admission: EM | Admit: 2020-02-18 | Discharge: 2020-02-18 | Disposition: A | Discharge status: Home / Self Care | Payer: BC Managed Care – PPO | Attending: Emergency Medicine | Admitting: Emergency Medicine

## 2020-02-18 ENCOUNTER — Other Ambulatory Visit: Payer: Self-pay

## 2020-02-18 ENCOUNTER — Emergency Department: Payer: BC Managed Care – PPO

## 2020-02-18 ENCOUNTER — Encounter: Payer: Self-pay | Admitting: *Deleted

## 2020-02-18 DIAGNOSIS — Z5321 Procedure and treatment not carried out due to patient leaving prior to being seen by health care provider: Secondary | ICD-10-CM | POA: Insufficient documentation

## 2020-02-18 DIAGNOSIS — S0191XA Laceration without foreign body of unspecified part of head, initial encounter: Secondary | ICD-10-CM | POA: Diagnosis not present

## 2020-02-18 DIAGNOSIS — W19XXXA Unspecified fall, initial encounter: Secondary | ICD-10-CM | POA: Insufficient documentation

## 2020-02-18 DIAGNOSIS — M542 Cervicalgia: Secondary | ICD-10-CM | POA: Diagnosis not present

## 2020-02-18 LAB — CBC
HCT: 46.8 % (ref 39.0–52.0)
Hemoglobin: 16.1 g/dL (ref 13.0–17.0)
MCH: 31 pg (ref 26.0–34.0)
MCHC: 34.4 g/dL (ref 30.0–36.0)
MCV: 90.2 fL (ref 80.0–100.0)
Platelets: 417 10*3/uL — ABNORMAL HIGH (ref 150–400)
RBC: 5.19 MIL/uL (ref 4.22–5.81)
RDW: 11.7 % (ref 11.5–15.5)
WBC: 8.9 10*3/uL (ref 4.0–10.5)
nRBC: 0 % (ref 0.0–0.2)

## 2020-02-18 LAB — COMPREHENSIVE METABOLIC PANEL
ALT: 45 U/L — ABNORMAL HIGH (ref 0–44)
AST: 31 U/L (ref 15–41)
Albumin: 4.2 g/dL (ref 3.5–5.0)
Alkaline Phosphatase: 50 U/L (ref 38–126)
Anion gap: 14 (ref 5–15)
BUN: 9 mg/dL (ref 6–20)
CO2: 23 mmol/L (ref 22–32)
Calcium: 9.7 mg/dL (ref 8.9–10.3)
Chloride: 99 mmol/L (ref 98–111)
Creatinine, Ser: 0.75 mg/dL (ref 0.61–1.24)
GFR, Estimated: >60 mL/min (ref >60)
Glucose, Bld: 212 mg/dL — ABNORMAL HIGH (ref 70–99)
Potassium: 3.8 mmol/L (ref 3.5–5.1)
Sodium: 136 mmol/L (ref 135–145)
Total Bilirubin: 1 mg/dL (ref 0.3–1.2)
Total Protein: 7.8 g/dL (ref 6.5–8.1)

## 2020-02-18 LAB — ETHANOL: Alcohol, Ethyl (B): 221 mg/dL — ABNORMAL HIGH (ref <10)

## 2020-02-18 NOTE — ED Triage Notes (Signed)
Pt to ED reporting a fall tonight that he does not have memory of. Pt reports he was feeling normal throughout the day but then woke to everyone standing over him tonight. Pt reports pain in head and neck with noted laceration to the left side of head. Pt is drowsy in triage room but able to answer questions. Pt also breathing heavily and unable to sit still in triage.

## 2020-03-07 DIAGNOSIS — M545 Low back pain, unspecified: Secondary | ICD-10-CM | POA: Diagnosis not present

## 2020-03-07 DIAGNOSIS — Z1331 Encounter for screening for depression: Secondary | ICD-10-CM | POA: Diagnosis not present

## 2020-03-07 DIAGNOSIS — E119 Type 2 diabetes mellitus without complications: Secondary | ICD-10-CM | POA: Diagnosis not present

## 2020-03-07 DIAGNOSIS — I10 Essential (primary) hypertension: Secondary | ICD-10-CM | POA: Diagnosis not present

## 2020-03-09 DIAGNOSIS — E119 Type 2 diabetes mellitus without complications: Secondary | ICD-10-CM | POA: Diagnosis not present

## 2020-09-05 DIAGNOSIS — E119 Type 2 diabetes mellitus without complications: Secondary | ICD-10-CM | POA: Diagnosis not present

## 2020-09-05 DIAGNOSIS — Z6831 Body mass index (BMI) 31.0-31.9, adult: Secondary | ICD-10-CM | POA: Diagnosis not present

## 2020-09-05 DIAGNOSIS — M545 Low back pain, unspecified: Secondary | ICD-10-CM | POA: Diagnosis not present

## 2020-12-06 DIAGNOSIS — E119 Type 2 diabetes mellitus without complications: Secondary | ICD-10-CM | POA: Diagnosis not present

## 2020-12-06 DIAGNOSIS — M545 Low back pain, unspecified: Secondary | ICD-10-CM | POA: Diagnosis not present

## 2020-12-06 DIAGNOSIS — Z6828 Body mass index (BMI) 28.0-28.9, adult: Secondary | ICD-10-CM | POA: Diagnosis not present

## 2022-01-18 IMAGING — CT CT CERVICAL SPINE W/O CM
3 of 4 series · 12 of 34 positions shown, 14 images · non-contrast
Comparison: CT orbits 07/13/2007.  Cervical spine MRI 05/10/2009.

CLINICAL DATA: Fall.  Head and neck pain with laceration.

EXAM:
CT HEAD WITHOUT CONTRAST
CT CERVICAL SPINE WITHOUT CONTRAST
TECHNIQUE: Multidetector CT imaging of the head and cervical spine was
performed following the standard protocol without intravenous
contrast. Multiplanar CT image reconstructions of the cervical spine
were also generated.

[Series 4: sagittal bone · sagittal · 0.34mm/px · 5 of 90 slices shown, 6 images]
[im 30/90  bone]
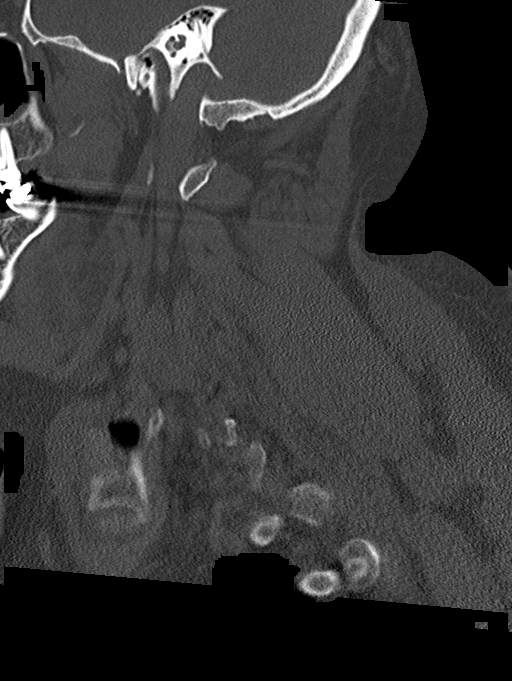
[im 38/90  bone]
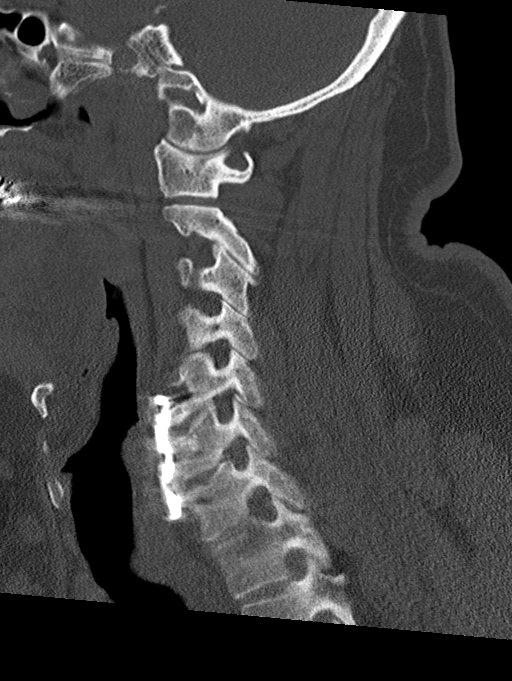
[im 45/90  soft-tissue]
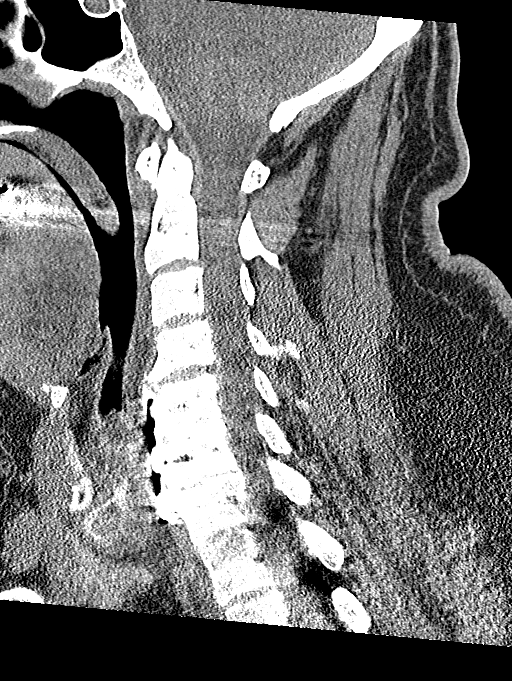
[im 45/90  bone]
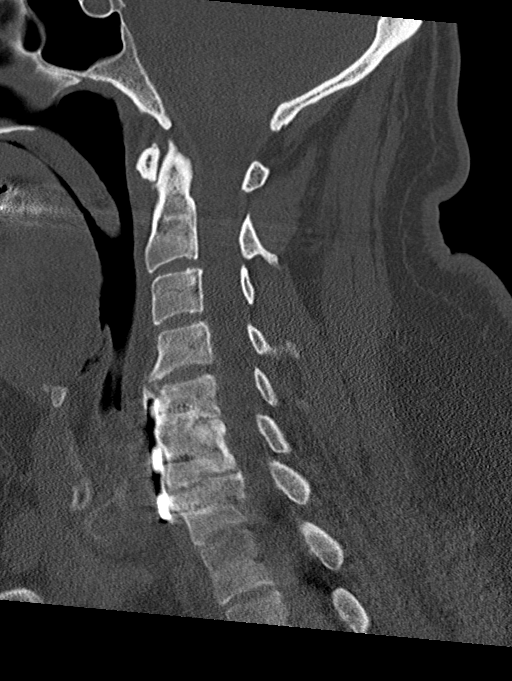
[im 52/90  bone]
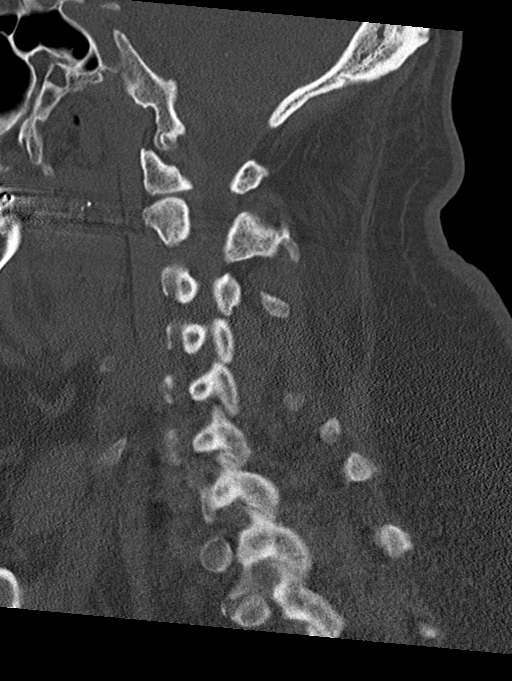
[im 60/90  bone]
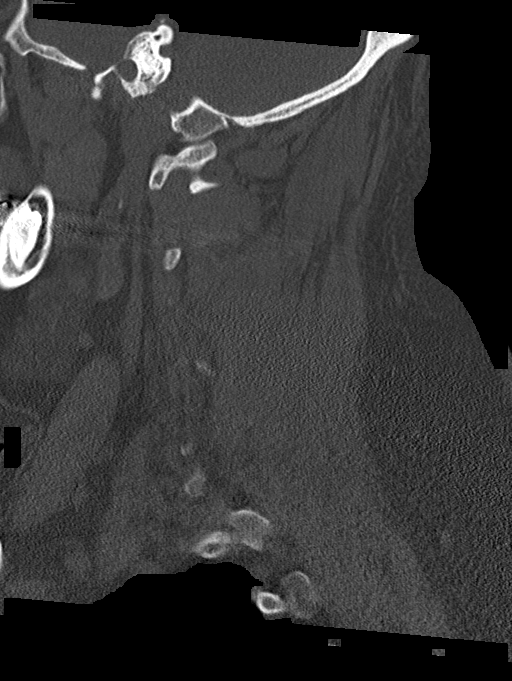

[Series 5: coronal bone · coronal · 0.37mm/px · 3 of 91 slices shown]
[im 19/91  bone]
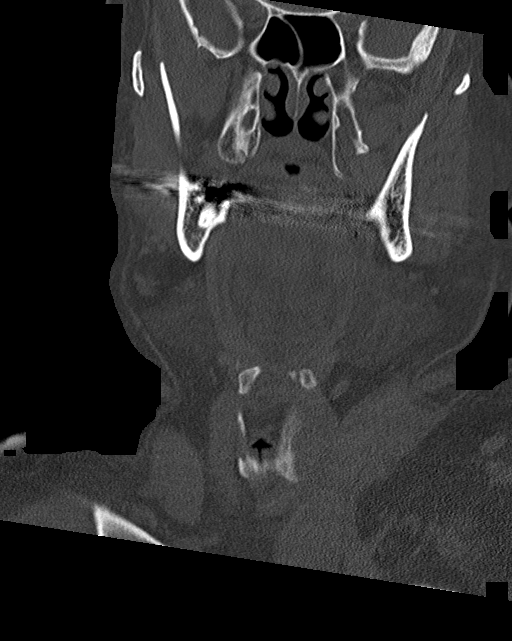
[im 37/91  bone]
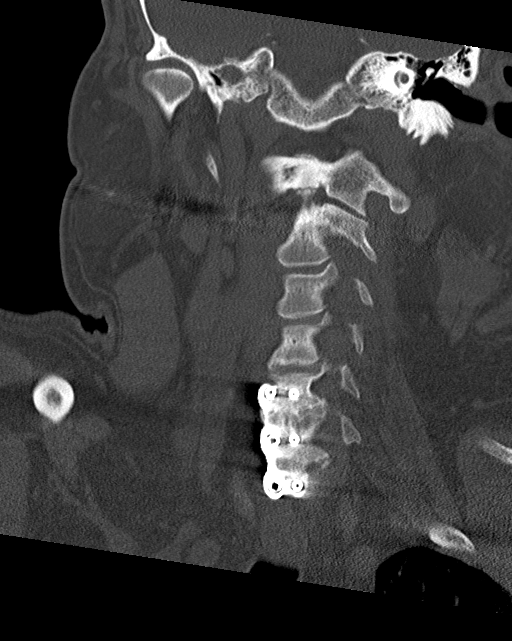
[im 55/91  bone]
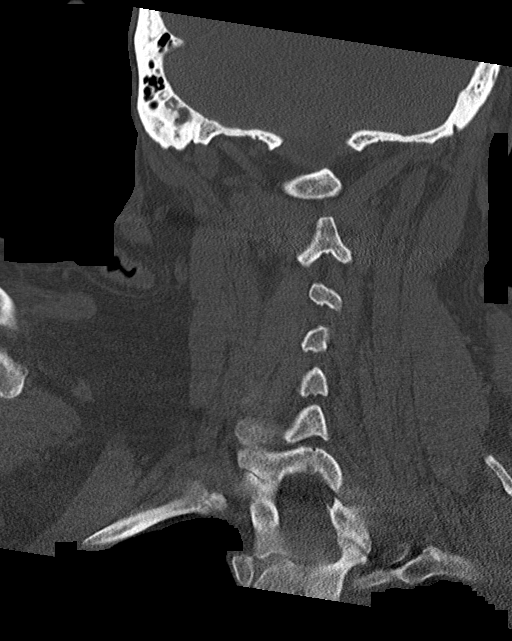

[Series 6: orthogonal bone · axial · 0.32mm/px · z∈[-279,-129]mm · 4 of 116 slices shown, 5 images]
[im 20/116  soft-tissue]
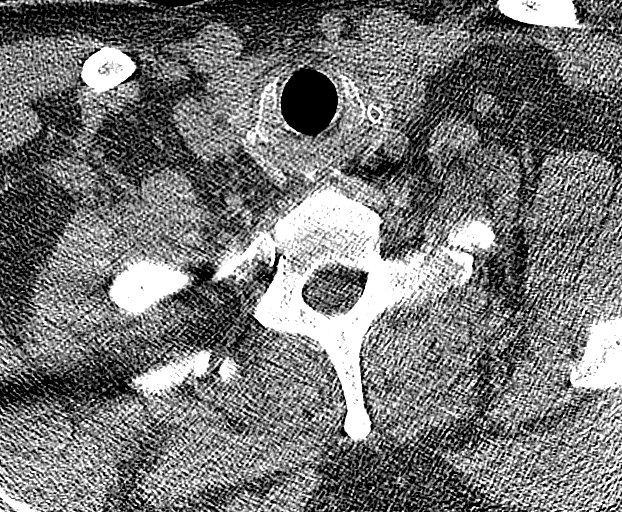
[im 20/116  bone]
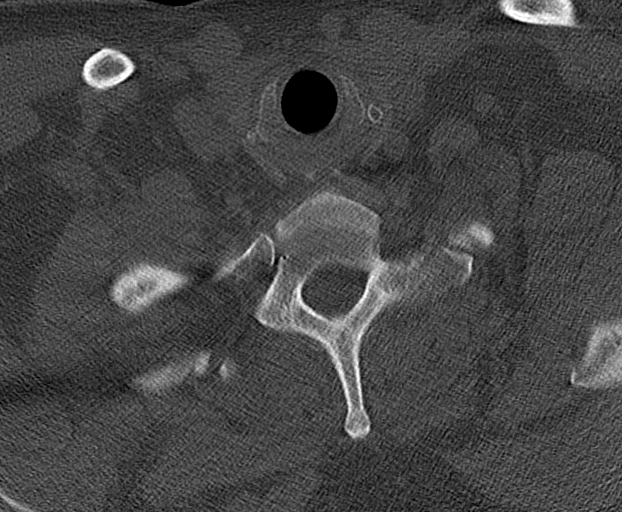
[im 39/116  bone]
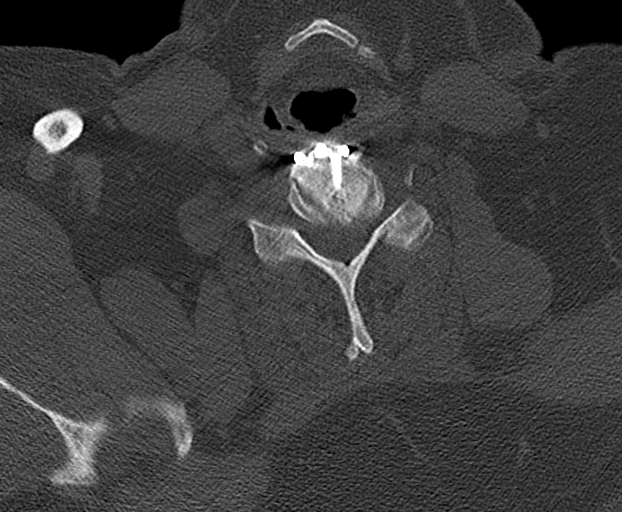
[im 77/116  bone]
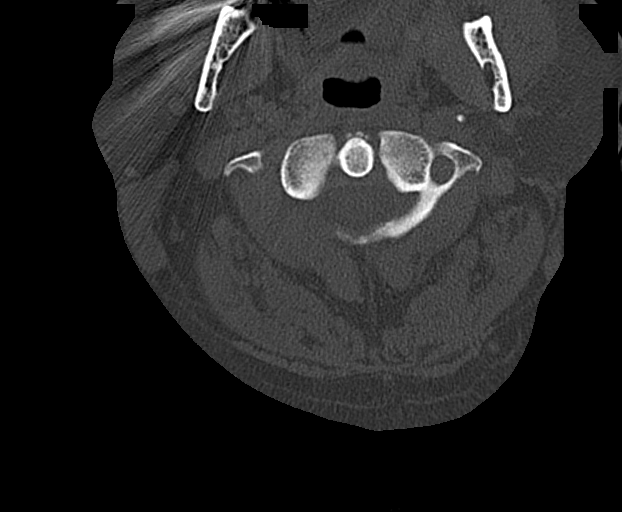
[im 96/116  bone]
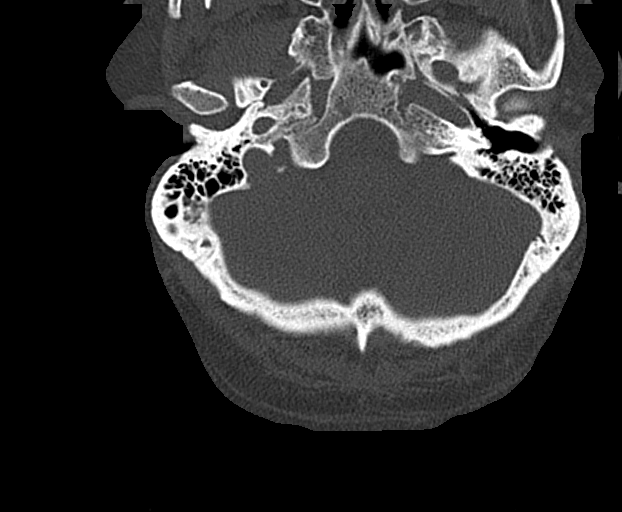

[12 of 34 positions shown; findings below may reference images not displayed]

FINDINGS: CT HEAD FINDINGS

Brain: There is no evidence of an acute infarct, intracranial
hemorrhage, mass, midline shift, or extra-axial fluid collection.
The ventricles and sulci are normal.

Vascular: No hyperdense vessel.

Skull: No fracture or suspicious osseous lesion.

Sinuses/Orbits: Partially visualized mucosal thickening in the right
maxillary sinus. Mild chronic right mastoid air cell opacification.
Unremarkable orbits.

Other: None.

CT CERVICAL SPINE FINDINGS

Alignment: Normal.

Skull base and vertebrae: No acute fracture or suspicious osseous
lesion. Interval C5-C7 ACDF with bridging bone across both disc
spaces.

Soft tissues and spinal canal: No prevertebral fluid or swelling. No
visible canal hematoma.

Disc levels: No high-grade osseous spinal canal or neural foraminal
stenosis.

Upper chest: Clear lung apices.

Other: None.
IMPRESSION: 1. No evidence of acute intracranial abnormality.
2. No evidence of acute fracture or traumatic subluxation in the
cervical spine.
3. C5-C7 ACDF.

## 2022-01-18 IMAGING — CT CT HEAD W/O CM
4 series · 16 of 47 positions shown, 18 images · non-contrast
Comparison: CT orbits 07/13/2007.  Cervical spine MRI 05/10/2009.

CLINICAL DATA: Fall.  Head and neck pain with laceration.

EXAM:
CT HEAD WITHOUT CONTRAST
CT CERVICAL SPINE WITHOUT CONTRAST
TECHNIQUE: Multidetector CT imaging of the head and cervical spine was
performed following the standard protocol without intravenous
contrast. Multiplanar CT image reconstructions of the cervical spine
were also generated.

[Series 2: head wo · axial · 0.47mm/px · z∈[-96,+29]mm · 7 of 35 slices shown, 9 images]
[im 5/35  brain]
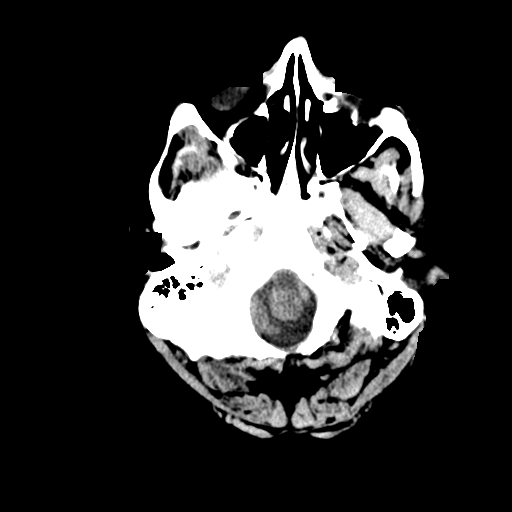
[im 5/35  bone]
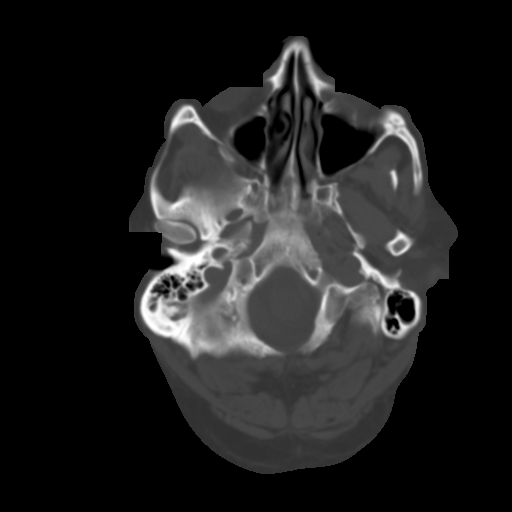
[im 9/35  brain]
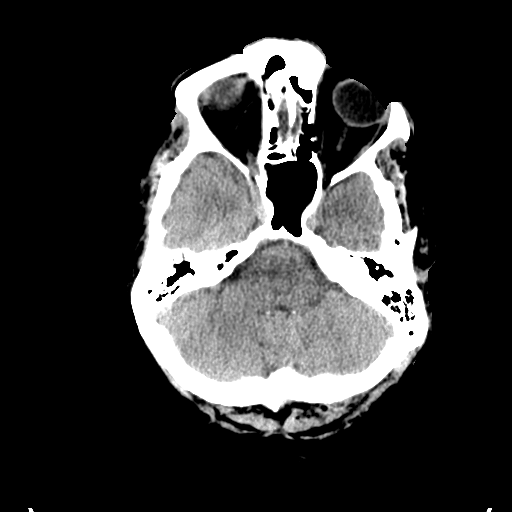
[im 13/35  brain]
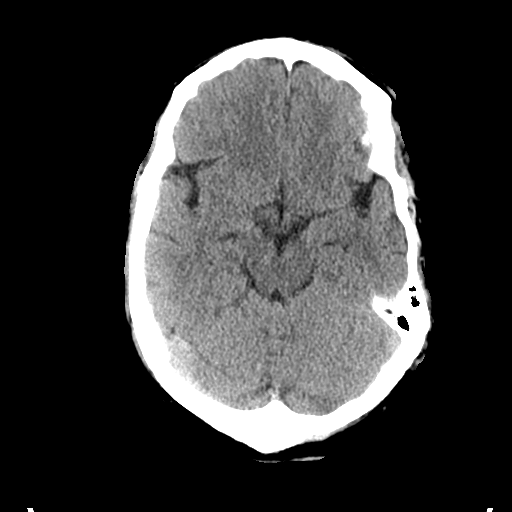
[im 18/35  brain]
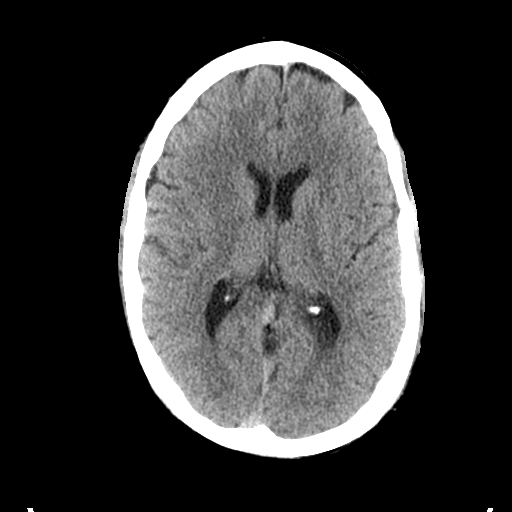
[im 22/35  brain]
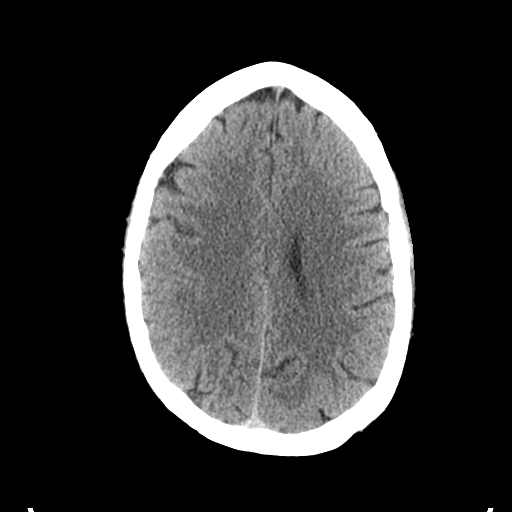
[im 22/35  bone]
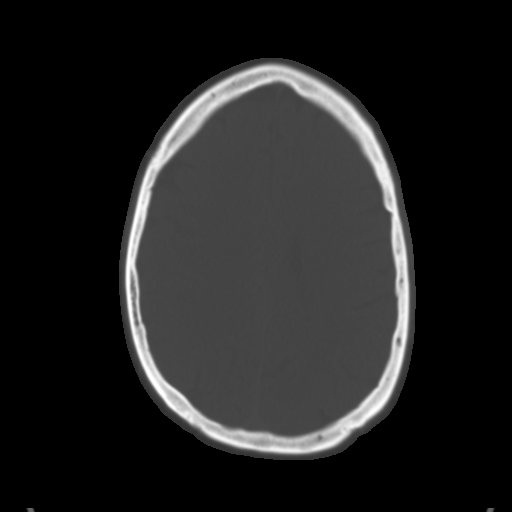
[im 26/35  brain]
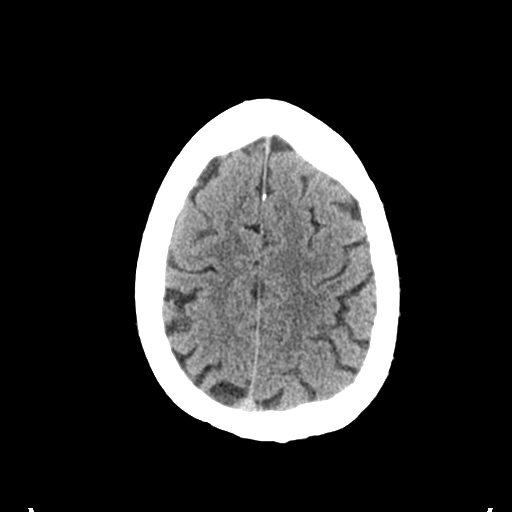
[im 30/35  brain]
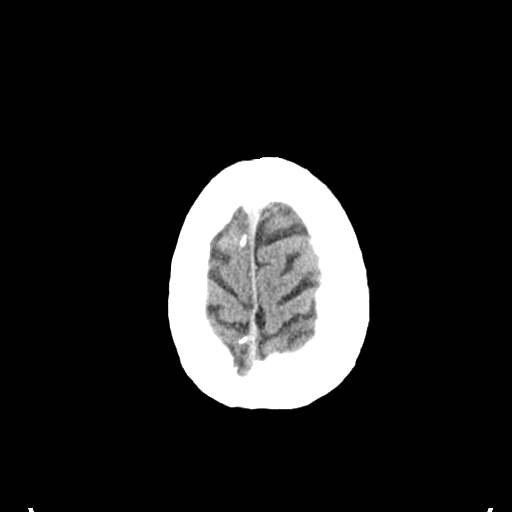

[Series 3: head bone · axial · 0.47mm/px · z∈[-100,-66]mm · 3 of 87 slices shown]
[im 9/87  bone]
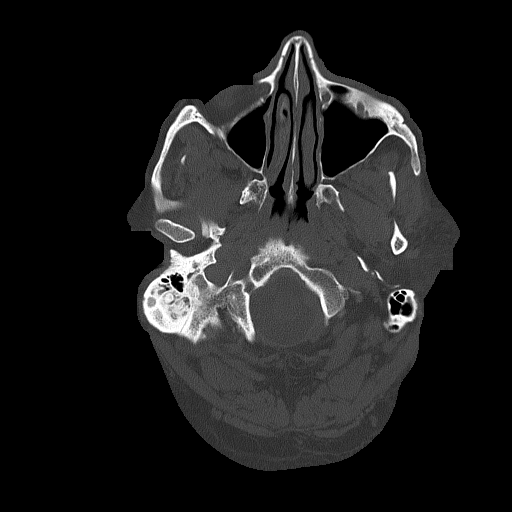
[im 18/87  bone]
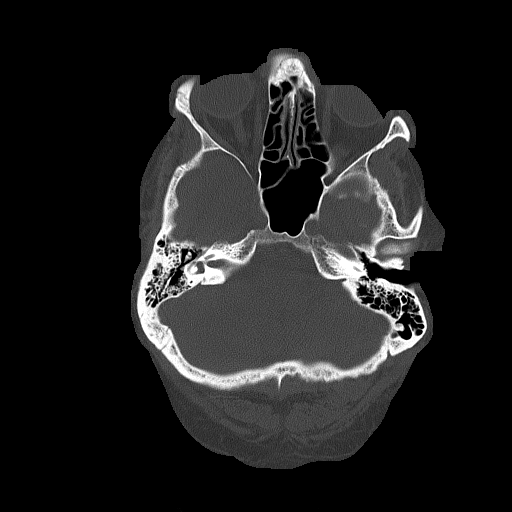
[im 26/87  bone]
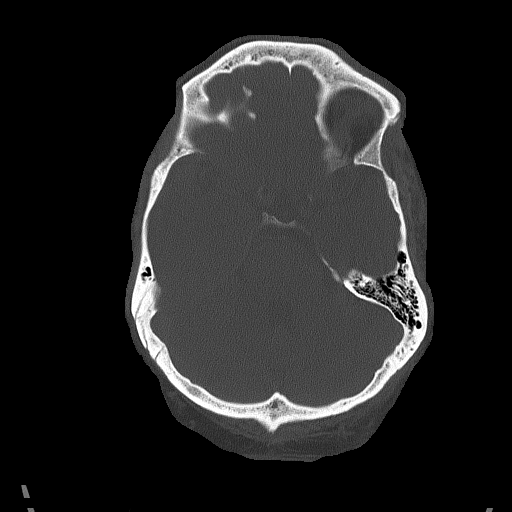

[Series 4: coronal soft tissue · coronal · 0.35mm/px · 3 of 74 slices shown]
[im 25/74  brain]
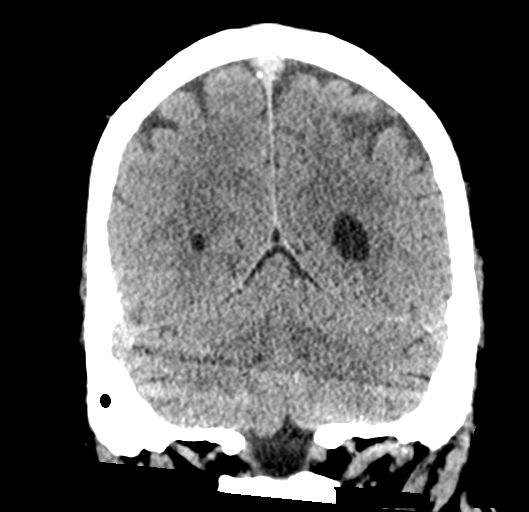
[im 33/74  brain]
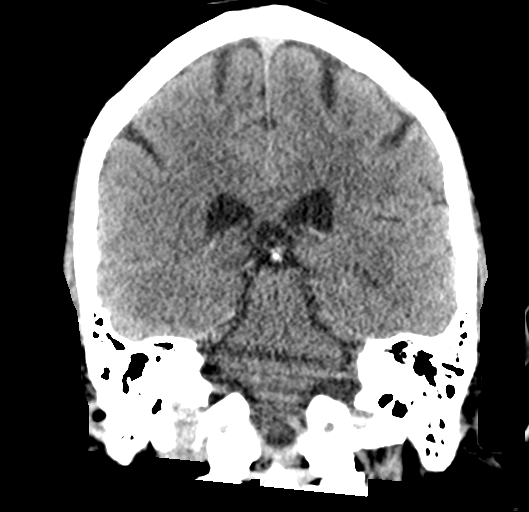
[im 41/74  brain]
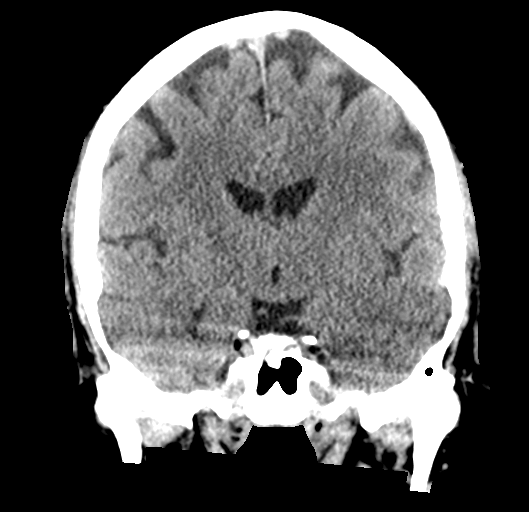

[Series 5: sagittal soft tissue · sagittal · 0.35mm/px · 3 of 61 slices shown]
[im 21/61  brain]
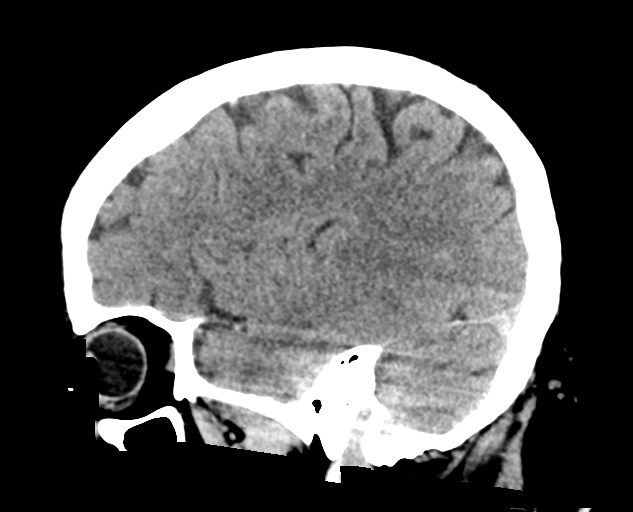
[im 31/61  brain]
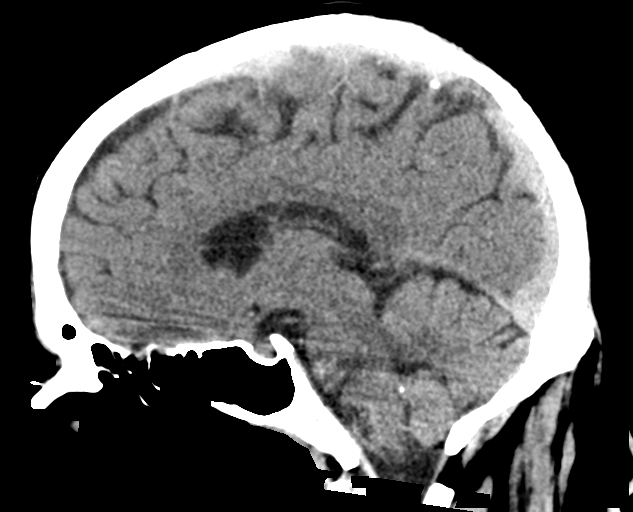
[im 41/61  brain]
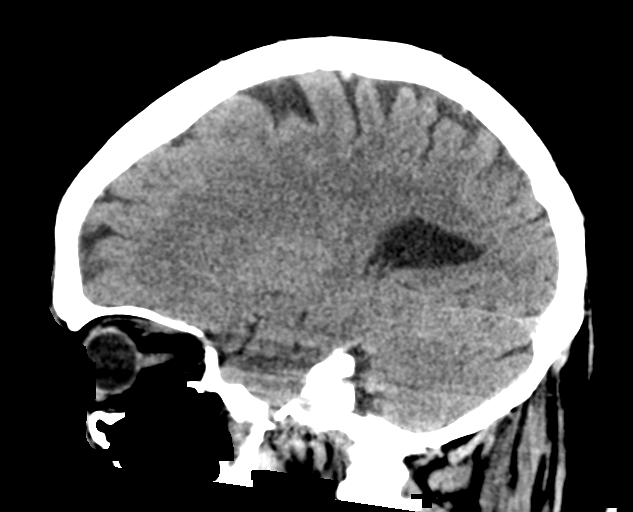

[16 of 47 positions shown; findings below may reference images not displayed]

FINDINGS: CT HEAD FINDINGS

Brain: There is no evidence of an acute infarct, intracranial
hemorrhage, mass, midline shift, or extra-axial fluid collection.
The ventricles and sulci are normal.

Vascular: No hyperdense vessel.

Skull: No fracture or suspicious osseous lesion.

Sinuses/Orbits: Partially visualized mucosal thickening in the right
maxillary sinus. Mild chronic right mastoid air cell opacification.
Unremarkable orbits.

Other: None.

CT CERVICAL SPINE FINDINGS

Alignment: Normal.

Skull base and vertebrae: No acute fracture or suspicious osseous
lesion. Interval C5-C7 ACDF with bridging bone across both disc
spaces.

Soft tissues and spinal canal: No prevertebral fluid or swelling. No
visible canal hematoma.

Disc levels: No high-grade osseous spinal canal or neural foraminal
stenosis.

Upper chest: Clear lung apices.

Other: None.
IMPRESSION: 1. No evidence of acute intracranial abnormality.
2. No evidence of acute fracture or traumatic subluxation in the
cervical spine.
3. C5-C7 ACDF.

## 2023-03-25 DIAGNOSIS — E119 Type 2 diabetes mellitus without complications: Secondary | ICD-10-CM | POA: Diagnosis not present

## 2023-03-25 DIAGNOSIS — M545 Low back pain, unspecified: Secondary | ICD-10-CM | POA: Diagnosis not present

## 2023-03-25 DIAGNOSIS — I10 Essential (primary) hypertension: Secondary | ICD-10-CM | POA: Diagnosis not present

## 2023-06-25 DIAGNOSIS — E119 Type 2 diabetes mellitus without complications: Secondary | ICD-10-CM | POA: Diagnosis not present

## 2023-06-25 DIAGNOSIS — Z1331 Encounter for screening for depression: Secondary | ICD-10-CM | POA: Diagnosis not present

## 2023-06-25 DIAGNOSIS — M79671 Pain in right foot: Secondary | ICD-10-CM | POA: Diagnosis not present

## 2023-06-25 DIAGNOSIS — I10 Essential (primary) hypertension: Secondary | ICD-10-CM | POA: Diagnosis not present

## 2023-08-15 DIAGNOSIS — S99921A Unspecified injury of right foot, initial encounter: Secondary | ICD-10-CM | POA: Diagnosis not present

## 2023-08-15 DIAGNOSIS — M79671 Pain in right foot: Secondary | ICD-10-CM | POA: Diagnosis not present

## 2023-08-18 ENCOUNTER — Ambulatory Visit: Admitting: Podiatry

## 2023-08-18 ENCOUNTER — Ambulatory Visit (INDEPENDENT_AMBULATORY_CARE_PROVIDER_SITE_OTHER)

## 2023-08-18 DIAGNOSIS — M629 Disorder of muscle, unspecified: Secondary | ICD-10-CM | POA: Diagnosis not present

## 2023-08-18 DIAGNOSIS — M7751 Other enthesopathy of right foot: Secondary | ICD-10-CM

## 2023-08-18 NOTE — Patient Instructions (Signed)
 When You Have a Walking Boot: Self-Care for Adults  A walking boot holds your foot or ankle in place after an injury or procedure. It helps with healing and stops your foot or ankle from getting hurt more. The boot has a hard, rigid outer frame. Its inner lining is a layer of padded material. The boots also have straps that you can adjust to secure them over your foot and leg. You may be given a walking boot if you can stand or walk on your injured foot. Ask how much you can walk while wearing the boot. How to put on your walking boot There are a few types of walking boots. Follow the steps for how to put on your certain type of boot. You may need to: Ask someone to help you put on the boot. Sit to put on the boot. Doing this is more comfortable. It also helps to prevent falls. Open up the boot fully. Put your foot in the boot so your heel rests against the back. Make sure your toes are supported by the base of the boot. They shouldn't hang over the front edge. Adjust the straps. The boot should feel secure but not too tight. Keep the boot straight. Do not bend the hard frame of the boot to get a good fit. How to walk with your walking boot How much you can walk will depend on your injury. Do not try to walk without wearing the boot until your health care provider says it's OK. While wearing the boot, you may need to: Use crutches or a cane as told. Wear a shoe with a heel on your other foot. This can help raise it to the height of the walking boot. Be careful when walking on surfaces that are uneven or wet. How to reduce swelling while using your walking boot  Rest your injured foot or leg as much as you can. Use ice or an ice pack as told. Take off your walking boot. Place a towel between your skin and the ice or between your cast and the ice. Leave the ice on for 20 minutes, 2-3 times a day. If your skin turns red, take off the ice right away to prevent skin damage. The risk of damage is  higher if you can't feel pain, heat, or cold. Move your toes often to reduce stiffness and swelling. Raise your foot or leg above the level of your heart while you're sitting down. Try to do this at least 2?3 hours each day. Use pillows as needed. If the swelling gets worse, loosen the boot. Rest your foot and leg. How to care for your skin and foot while using your walking boot Wear a long sock to stop your foot and leg from rubbing inside the boot. Take off the boot once a day to check the injured area. Check for sores, rashes, swelling, or wounds. The skin should be a healthy color. It shouldn't be pale or blue. Try to watch your walking pattern (gait) in the boot. Make sure it's fairly normal and that you don't walk with a clear limp. Take care of any wound or cut from surgery as told. Clean and wash the injured area as told. Gently dry your foot and leg before putting the boot back on. How to take off your walking boot Take off the walking boot as told by your provider. In most cases, it's OK to take off the boot: When you're resting or sleeping. To clean your foot  and leg. How to keep your walking boot clean Do not put any part of the boot in a washing machine or dryer. Do not use chemical cleaning products. These may irritate your skin. Do not soak the liner of the boot. Use a washcloth with mild soap and water to clean the frame and liner of the boot by hand. Let the boot dry fully before you put it back on your foot. Follow these instructions at home: Activity Ask what things are safe for you to do. Bathe and shower as told by your provider. Do not do things that could make your injury worse. Ask when it's safe to drive. Contact a health care provider if: The boot is cracked or damaged. The boot doesn't fit right. Your foot or leg hurts. You have a rash, sore, or open sore on your foot or leg. The skin on your foot or leg is pale. You have a wound or cut on your foot that's  getting worse. Your skin is painful, red, or irritated. Your swelling doesn't get better, or it gets worse. Get help right away if: Your foot or leg turns numb. The skin on your foot or leg is: Cold. Blue. Wallace Cullens. This information is not intended to replace advice given to you by your health care provider. Make sure you discuss any questions you have with your health care provider. Document Revised: 08/16/2022 Document Reviewed: 08/16/2022 Elsevier Patient Education  2024 [employer].

## 2023-08-20 NOTE — Progress Notes (Signed)
 Subjective:  Patient ID: Robert Shah, male    DOB: 11-Aug-1970,  MRN: [id number]  Chief Complaint  Patient presents with   Foot Pain    RM# Right foot pain started after tried running and felt something pull in foot pain generally located in heel area.    Discussed the use of AI scribe software for clinical note transcription with the patient, who gave verbal consent to proceed.  History of Present Illness Robert Shah is a 53 year old male who presents with right foot pain following a running injury.  He experienced a severe ripping sensation in his right foot while running, with pain localized to the heel and bottom of the foot. The foot had been sore for weeks to months prior, with pain upon weight-bearing and cramping at night. Since the injury on Thursday, there is swelling but no bruising. Initially, he could not tolerate touch due to severe pain. He received Kenalog  and Toradol injections and has been using crutches. The foot feels like 'something was stretched and popped back like a rubber band' when attempting to walk without crutches.  The foot has been half numb since a previous surgery, with no pain when pressing on the top of the foot. He takes Flexeril and ibuprofen for pain management. He adjusts his walking pattern to avoid pain, which may lead to other issues. He is concerned about returning to work, as he typically wears steel-toed boots. There is no swelling or bruising of the foot, and no pain in the Achilles tendon, top of the foot, or ankle. Numbness in the foot since a previous surgery.      Objective:    Physical Exam General: AAO x3, NAD  Dermatological: Skin is warm, dry and supple bilateral.  There are no open sores, no preulcerative lesions, no rash or signs of infection present.  Vascular: Dorsalis Pedis artery and Posterior Tibial artery pedal pulses are 2/4 bilateral with immedate capillary fill time. There is no pain with calf compression, swelling,  warmth, erythema.   Neruologic: Grossly intact via light touch bilateral.   Musculoskeletal: Tenderness palpation along the plantar aspect of calcaneus as well as the arch of the foot along the plantar fascia.  Not able to distinguish the borders of the plantar fascia in the arch of the foot.  There is no pain to the Achilles tendon Sebastian test is negative.  There is no area pinpoint tenderness.  There is some edema present to the foot but there is no erythema or warmth.      No images are attached to the encounter.    Results RADIOLOGY Right foot X-ray: No evidence of acute fracture.  No new findings compared to X-rays from four years ago.   Assessment:   1. Nontraumatic tear of plantar fascia      Plan:  Patient was evaluated and treated and all questions answered.  Assessment and Plan Assessment & Plan Plantar fascia tear Likely acute tear of the right plantar fascia with pain on weight-bearing. No significant swelling or bruising. Expected to heal without surgery. No long-term effects anticipated. - Provide walking boot to offload pressure.  This is to facilitate soft tissue healing. - Instruct to ice heel and arch. - Continue ibuprofen and Flexeril as needed. - Consider plantar fascia brace for work with steel-toed boots. - Schedule follow-up in one month. - Advise earlier return if pain worsens; consider MRI if necessary.   Return in about 4 weeks (around 09/15/2023) for plantar fascia  tear right .   Donnice JONELLE Fees DPM

## 2023-09-18 ENCOUNTER — Encounter: Payer: Self-pay | Admitting: Podiatry

## 2023-09-18 ENCOUNTER — Ambulatory Visit: Admitting: Podiatry

## 2023-09-18 DIAGNOSIS — M79605 Pain in left leg: Secondary | ICD-10-CM | POA: Diagnosis not present

## 2023-09-18 DIAGNOSIS — M79604 Pain in right leg: Secondary | ICD-10-CM | POA: Diagnosis not present

## 2023-09-18 DIAGNOSIS — M629 Disorder of muscle, unspecified: Secondary | ICD-10-CM

## 2023-09-18 NOTE — Progress Notes (Signed)
  Subjective:  Patient ID: Robert Shah, male    DOB: 09-07-1970,  MRN: [id number]  Chief Complaint  Patient presents with   Foot pain    Rm13 Plantar fascia tear right f/u patient says that he still has some tenderness overall he is doing well.    History of Present Illness Robert Shah is a 53 year old male who presents with right foot pain following a running injury.  He states that overall he is feeling better.  He actually feels that mobility to his foot is the best it has been in quite some time since he had surgery.  Still gets some tenderness mostly to the bottom of the heel.  No recent injuries or changes otherwise.  States that he was doing well in the boot helped quite a bit when he started jumping in the pool heel.  He does state that he had some cramping to his leg mostly at nighttime with the right side worse than left.      Objective:    Physical Exam General: AAO x3, NAD  Dermatological: Skin is warm, dry and supple bilateral.  There are no open sores, no preulcerative lesions, no rash or signs of infection present.  Vascular: Dorsalis Pedis artery and Posterior Tibial artery pedal pulses are 2/4 bilateral with immedate capillary fill time. There is no pain with calf compression, swelling, warmth, erythema.   Neruologic: Grossly intact via light touch bilateral.   Musculoskeletal: Tenderness palpation along the plantar aspect of calcaneus mostly on the plantar medial tubercle.  No significant discomfort to the arch of the foot.  Unable to appreciate any area pinpoint tenderness but there is no tenderness or deficit noted along the Achilles tendon.  There is no edema.     Assessment:   1. Nontraumatic tear of plantar fascia   2.      Leg pain/cramps   Plan:  Patient was evaluated and treated and all questions answered.  Assessment and Plan Assessment & Plan Plantar fascia tear -Overall symptoms have improved but now he is getting pain mostly to the  bottom of the heel.  At this time we discussed working on some stretching, rehab exercises.  Dispensed a night splint as well. Static or dynamic ankle foot orthosis, including soft interface material, adjustable for fit, for positioning, may be used for minimal ambulation, prefabricated, off-the-shelf was dispensed on the billed date of service.  We discussed shoes, good arch support.  Leg cramps -Discussed different etiologies of this.  Given the pain to his legs I ordered a DVT study although my assistant patient is quite low.  We discussed Thera works to applied to his legs as well at night.  Return in about 6 weeks (around 10/30/2023), or if symptoms worsen or fail to improve.  Donnice JONELLE Fees DPM

## 2023-09-18 NOTE — Patient Instructions (Addendum)
 You can try THERAWORX cream for the cramping  --  For instructions on how to put on your Night Splint, please visit [email or web address]  --  Plantar Fasciitis (Heel Spur Syndrome) with Rehab The plantar fascia is a fibrous, ligament-like, soft-tissue structure that spans the bottom of the foot. Plantar fasciitis is a condition that causes pain in the foot due to inflammation of the tissue. SYMPTOMS  Pain and tenderness on the underneath side of the foot. Pain that worsens with standing or walking. CAUSES  Plantar fasciitis is caused by irritation and injury to the plantar fascia on the underneath side of the foot. Common mechanisms of injury include: Direct trauma to bottom of the foot. Damage to a small nerve that runs under the foot where the main fascia attaches to the heel bone. Stress placed on the plantar fascia due to bone spurs. RISK INCREASES WITH:  Activities that place stress on the plantar fascia (running, jumping, pivoting, or cutting). Poor strength and flexibility. Improperly fitted shoes. Tight calf muscles. Flat feet. Failure to warm-up properly before activity. Obesity. PREVENTION Warm up and stretch properly before activity. Allow for adequate recovery between workouts. Maintain physical fitness: Strength, flexibility, and endurance. Cardiovascular fitness. Maintain a health body weight. Avoid stress on the plantar fascia. Wear properly fitted shoes, including arch supports for individuals who have flat feet.  PROGNOSIS  If treated properly, then the symptoms of plantar fasciitis usually resolve without surgery. However, occasionally surgery is necessary.  RELATED COMPLICATIONS  Recurrent symptoms that may result in a chronic condition. Problems of the lower back that are caused by compensating for the injury, such as limping. Pain or weakness of the foot during push-off following surgery. Chronic inflammation, scarring, and partial or complete  fascia tear, occurring more often from repeated injections.  TREATMENT  Treatment initially involves the use of ice and medication to help reduce pain and inflammation. The use of strengthening and stretching exercises may help reduce pain with activity, especially stretches of the Achilles tendon. These exercises may be performed at home or with a therapist. Your caregiver may recommend that you use heel cups of arch supports to help reduce stress on the plantar fascia. Occasionally, corticosteroid injections are given to reduce inflammation. If symptoms persist for greater than 6 months despite non-surgical (conservative), then surgery may be recommended.   MEDICATION  If pain medication is necessary, then nonsteroidal anti-inflammatory medications, such as aspirin and ibuprofen, or other minor pain relievers, such as acetaminophen , are often recommended. Do not take pain medication within 7 days before surgery. Prescription pain relievers may be given if deemed necessary by your caregiver. Use only as directed and only as much as you need. Corticosteroid injections may be given by your caregiver. These injections should be reserved for the most serious cases, because they may only be given a certain number of times.  HEAT AND COLD Cold treatment (icing) relieves pain and reduces inflammation. Cold treatment should be applied for 10 to 15 minutes every 2 to 3 hours for inflammation and pain and immediately after any activity that aggravates your symptoms. Use ice packs or massage the area with a piece of ice (ice massage). Heat treatment may be used prior to performing the stretching and strengthening activities prescribed by your caregiver, physical therapist, or athletic trainer. Use a heat pack or soak the injury in warm water.  SEEK IMMEDIATE MEDICAL CARE IF: Treatment seems to offer no benefit, or the condition worsens. Any medications produce adverse side effects.  EXERCISES- RANGE OF  MOTION (ROM) AND STRETCHING EXERCISES - Plantar Fasciitis (Heel Spur Syndrome) These exercises may help you when beginning to rehabilitate your injury. Your symptoms may resolve with or without further involvement from your physician, physical therapist or athletic trainer. While completing these exercises, remember:  Restoring tissue flexibility helps normal motion to return to the joints. This allows healthier, less painful movement and activity. An effective stretch should be held for at least 30 seconds. A stretch should never be painful. You should only feel a gentle lengthening or release in the stretched tissue.  RANGE OF MOTION - Toe Extension, Flexion Sit with your right / left leg crossed over your opposite knee. Grasp your toes and gently pull them back toward the top of your foot. You should feel a stretch on the bottom of your toes and/or foot. Hold this stretch for 10 seconds. Now, gently pull your toes toward the bottom of your foot. You should feel a stretch on the top of your toes and or foot. Hold this stretch for 10 seconds. Repeat  times. Complete this stretch 3 times per day.   RANGE OF MOTION - Ankle Dorsiflexion, Active Assisted Remove shoes and sit on a chair that is preferably not on a carpeted surface. Place right / left foot under knee. Extend your opposite leg for support. Keeping your heel down, slide your right / left foot back toward the chair until you feel a stretch at your ankle or calf. If you do not feel a stretch, slide your bottom forward to the edge of the chair, while still keeping your heel down. Hold this stretch for 10 seconds. Repeat 3 times. Complete this stretch 2 times per day.   STRETCH  Gastroc, Standing Place hands on wall. Extend right / left leg, keeping the front knee somewhat bent. Slightly point your toes inward on your back foot. Keeping your right / left heel on the floor and your knee straight, shift your weight toward the wall, not  allowing your back to arch. You should feel a gentle stretch in the right / left calf. Hold this position for 10 seconds. Repeat 3 times. Complete this stretch 2 times per day.  STRETCH  Soleus, Standing Place hands on wall. Extend right / left leg, keeping the other knee somewhat bent. Slightly point your toes inward on your back foot. Keep your right / left heel on the floor, bend your back knee, and slightly shift your weight over the back leg so that you feel a gentle stretch deep in your back calf. Hold this position for 10 seconds. Repeat 3 times. Complete this stretch 2 times per day.  STRETCH  Gastrocsoleus, Standing  Note: This exercise can place a lot of stress on your foot and ankle. Please complete this exercise only if specifically instructed by your caregiver.  Place the ball of your right / left foot on a step, keeping your other foot firmly on the same step. Hold on to the wall or a rail for balance. Slowly lift your other foot, allowing your body weight to press your heel down over the edge of the step. You should feel a stretch in your right / left calf. Hold this position for 10 seconds. Repeat this exercise with a slight bend in your right / left knee. Repeat 3 times. Complete this stretch 2 times per day.   STRENGTHENING EXERCISES - Plantar Fasciitis (Heel Spur Syndrome)  These exercises may help you when beginning to rehabilitate your  injury. They may resolve your symptoms with or without further involvement from your physician, physical therapist or athletic trainer. While completing these exercises, remember:  Muscles can gain both the endurance and the strength needed for everyday activities through controlled exercises. Complete these exercises as instructed by your physician, physical therapist or athletic trainer. Progress the resistance and repetitions only as guided.  STRENGTH - Towel Curls Sit in a chair positioned on a non-carpeted surface. Place your foot  on a towel, keeping your heel on the floor. Pull the towel toward your heel by only curling your toes. Keep your heel on the floor. Repeat 3 times. Complete this exercise 2 times per day.  STRENGTH - Ankle Inversion Secure one end of a rubber exercise band/tubing to a fixed object (table, pole). Loop the other end around your foot just before your toes. Place your fists between your knees. This will focus your strengthening at your ankle. Slowly, pull your big toe up and in, making sure the band/tubing is positioned to resist the entire motion. Hold this position for 10 seconds. Have your muscles resist the band/tubing as it slowly pulls your foot back to the starting position. Repeat 3 times. Complete this exercises 2 times per day.  Document Released: 02/11/2005 Document Revised: 05/06/2011 Document Reviewed: 05/26/2008 [facility] Patient Information 2014 [place], [place].

## 2023-09-29 DIAGNOSIS — E119 Type 2 diabetes mellitus without complications: Secondary | ICD-10-CM | POA: Diagnosis not present

## 2023-09-29 DIAGNOSIS — Z125 Encounter for screening for malignant neoplasm of prostate: Secondary | ICD-10-CM | POA: Diagnosis not present

## 2023-09-29 DIAGNOSIS — I10 Essential (primary) hypertension: Secondary | ICD-10-CM | POA: Diagnosis not present

## 2023-09-29 DIAGNOSIS — R252 Cramp and spasm: Secondary | ICD-10-CM | POA: Diagnosis not present

## 2023-10-02 ENCOUNTER — Ambulatory Visit (HOSPITAL_COMMUNITY)
Admission: RE | Admit: 2023-10-02 | Discharge: 2023-10-02 | Disposition: A | Discharge status: Home / Self Care | Source: Ambulatory Visit | Attending: Podiatry | Admitting: Podiatry

## 2023-10-02 DIAGNOSIS — M79605 Pain in left leg: Secondary | ICD-10-CM | POA: Insufficient documentation

## 2023-10-02 DIAGNOSIS — M79604 Pain in right leg: Secondary | ICD-10-CM | POA: Insufficient documentation

## 2023-10-06 ENCOUNTER — Ambulatory Visit: Payer: Self-pay | Admitting: Podiatry

## 2023-10-06 NOTE — Progress Notes (Signed)
 Spoke to patient and informed him of normal ultrasound.

## 2023-10-15 DIAGNOSIS — L03019 Cellulitis of unspecified finger: Secondary | ICD-10-CM | POA: Diagnosis not present

## 2023-10-30 ENCOUNTER — Ambulatory Visit: Admitting: Podiatry

## 2023-12-31 DIAGNOSIS — E119 Type 2 diabetes mellitus without complications: Secondary | ICD-10-CM | POA: Diagnosis not present

## 2023-12-31 DIAGNOSIS — M545 Low back pain, unspecified: Secondary | ICD-10-CM | POA: Diagnosis not present

## 2023-12-31 DIAGNOSIS — I10 Essential (primary) hypertension: Secondary | ICD-10-CM | POA: Diagnosis not present
